# Patient Record
Sex: Male | Born: 1975 | Race: White | Hispanic: Yes | Marital: Married | Smoking: Light tobacco smoker
Health system: Southern US, Community
[De-identification: ages and names within clinical notes are randomized; demographics above are authoritative.]

---

## 2006-06-13 ENCOUNTER — Emergency Department (HOSPITAL_COMMUNITY): Admission: EM | Admit: 2006-06-13 | Discharge: 2006-06-13 | Payer: Self-pay | Admitting: Emergency Medicine

## 2007-08-25 ENCOUNTER — Emergency Department (HOSPITAL_COMMUNITY): Admission: EM | Admit: 2007-08-25 | Discharge: 2007-08-25 | Payer: Self-pay | Admitting: Emergency Medicine

## 2008-03-30 ENCOUNTER — Encounter: Admission: RE | Admit: 2008-03-30 | Discharge: 2008-03-30 | Payer: Self-pay | Admitting: Specialist

## 2013-03-23 ENCOUNTER — Ambulatory Visit (INDEPENDENT_AMBULATORY_CARE_PROVIDER_SITE_OTHER): Payer: Self-pay | Admitting: Emergency Medicine

## 2013-03-23 VITALS — BP 136/86 | HR 90 | Temp 97.8°F | Resp 16 | Ht 70.0 in | Wt 235.0 lb

## 2013-03-23 DIAGNOSIS — M25561 Pain in right knee: Secondary | ICD-10-CM

## 2013-03-23 DIAGNOSIS — M25569 Pain in unspecified knee: Secondary | ICD-10-CM

## 2013-03-23 LAB — POCT CBC
Granulocyte percent: 57.3 %G (ref 37–80)
HEMATOCRIT: 48 % (ref 43.5–53.7)
HEMOGLOBIN: 15.6 g/dL (ref 14.1–18.1)
LYMPH, POC: 3.1 (ref 0.6–3.4)
MCH, POC: 31.8 pg — AB (ref 27–31.2)
MCHC: 32.5 g/dL (ref 31.8–35.4)
MCV: 97.7 fL — AB (ref 80–97)
MID (cbc): 0.7 (ref 0–0.9)
MPV: 9.3 fL (ref 0–99.8)
POC GRANULOCYTE: 5.2 (ref 2–6.9)
POC LYMPH %: 34.7 % (ref 10–50)
POC MID %: 8 %M (ref 0–12)
Platelet Count, POC: 284 10*3/uL (ref 142–424)
RBC: 4.91 M/uL (ref 4.69–6.13)
RDW, POC: 13.7 %
WBC: 9 10*3/uL (ref 4.6–10.2)

## 2013-03-23 LAB — GLUCOSE, POCT (MANUAL RESULT ENTRY): POC GLUCOSE: 68 mg/dL — AB (ref 70–99)

## 2013-03-23 MED ORDER — TRAMADOL HCL 50 MG PO TABS: 50.0000 mg | ORAL_TABLET | Freq: Three times a day (TID) | ORAL | Status: DC | PRN

## 2013-03-23 MED ORDER — MELOXICAM 15 MG PO TABS: 15.0000 mg | ORAL_TABLET | Freq: Every day | ORAL | Status: DC

## 2013-03-23 NOTE — Patient Instructions (Addendum)
I am very concerned about the amount of alcohol you are drinking. I would like for you to try to decrease your alcohol consumption.   Dolor Patelofemoral (Patellofemoral Pain) Los exmenes muestran que el dolor que sufre en la rodilla se debe a un problema en la rtula. Este problema tambin se denomina dolor patelofemoral, rodilla de corredor o condromalacia. La mayor parte de las veces, el problema se debe al uso excesivo de la articulacin de la rodilla. Al doblar y Building control surveyor, puede irritar la zona interior de la rtula. Cuando esto ocurre, las [employer] correr, Advertising account planner, trepar, Chief Strategy Officer. Tambin puede sentir dolor despus de estar mucho tiempo sentado. Otros sntomas son: rigidez Nurse, learning disability, hinchazn y Neomia Dear sensacin de golpeteo o Merchant navy officer con Conservation officer, historic buildings. El xito del tratamiento se logra con reposo y rehabilitacin. En pocos casos es necesaria [place]. El tratamiento incluye la correccin de todos los factores mecnicos que puedan lesionar el normal funcionamiento de la rodilla. Esto puede debilitar los msculos del muslo o traer [employer] [facility]. Evite las actividades repetitivas de la rodilla hasta que el dolor y otros sntomas mejoren. El dolor y la inflamacin pueden reducirse aplicando bolsas de hielo en la lesin por 20 a 30 minutos cada 2 a 4 horas. Utilice los medicamentos de venta libre o de prescripcin para Chief Technology Officer, Environmental health practitioner o la [place], segn se lo indique el profesional que lo asiste. Los aparatos ortopdicos y las rodilleras de Education officer, environmental a Geographical information systems officer. Los ejercicios de rehabilitacin para fortalecer el cudriceps se indican cuando los sntomas mejoran. Llame al profesional que lo asiste para Education officer, environmental un control tal como le hayan recomendado. Document Released: 01/12/2005 Document Revised: 04/06/2011 [facility] Patient Information 2014 [place], [place]. Gota  (Gout)  La gota es  una artritis inflamatoria originada por la acumulacin de cristales de cido rico en las articulaciones. El cido rico es una sustancia qumica que normalmente se encuentra en la [place]. Cuando los niveles de cido rico en la sangre son muy elevados, pueden formarse cristales que se depositan en las articulaciones y los tejidos. Esto causa irritacin, dolor e hinchazn (inflamacin). La repeticin de los ataques es frecuente. Con el tiempo, los cristales de cido rico pueden formar [employer] (tofos) cerca de Risk analyst, destruyen el [place] y provocan una deformidad La gota es una enfermedad que puede tratarse y con frecuencia puede prevenirse. CAUSAS  La enfermedad comienza con niveles altos de cido rico en la sangre. El organismo produce cido rico cuando metaboliza una sustancia que se encuentra en [place] natural, denominada purina. Ciertos alimentos, como carnes y pescado, contienen grandes cantidades de purinas. Las causas de cido rico elevado son:   Ser transmitida de padres a hijos (hereditaria).  Enfermedades que causan un aumento de la produccin de cido rico (como la obesidad, la psoriasis y ciertos tipos de Database administrator).  Abuso en el consumo de bebidas alcohlicas.  La dieta, especialmente las [employer] se consume [place] carne y frutos de mar.  Ciertos medicamentos, como los que combaten el cncer (quimioterapia), los diurticos y la aspirina.  Enfermedades renales crnicas. Los riones no pueden eliminar bien el cido rico.  Problemas con el metabolismo. Las enfermedades fuertemente asociadas a la gota son:   Janene Harvey.  Hipertensin arterial.  Colesterol elevado.  Diabetes. No todas las personas con niveles elevados de cido rico sufren gota. No se comprende an por qu algunas personas padecen gota y otras no.  Las [place], lesiones en una articulacin y el consumo excesivo de ciertos alimentos son algunos de los factores que pueden provocar ataques de [place].   SNTOMAS   Un ataque de gota puede comenzar rpidamente. Causa un dolor intenso, con irritacin, hinchazn y calor en una articulacin.  Puede haber fiebre.  Generalmente slo una articulacin es [place]. Ciertas articulaciones se ven implicadas con ms frecuencia.  La base del dedo gordo del pie.  La rodilla.  El tobillo.  [place].  Un dedo. Sin tratamiento, un ataque por lo general desaparece en unos pocos das o semanas. Entre un ataque y otro, por lo general no hay sntomas, lo cual es diferente de [place] formas de artritis.  DIAGNSTICO  El profesional reunir la informacin basndose en los sntomas y el examen fsico. En algunos casos indicar estudios. Estos estudios pueden ser:   Anlisis de [place].  Anlisis de [place].  Radiografas.  Anlisis de los fluidos de Nurse, learning disability. En este estudio se necesita una aguja para extraer lquido de la articulacin (artrocentesis). Con el uso del microscopio, se confirma la gota cuando se observan los cristales de cido rico en el lquido de Nurse, learning disability. TRATAMIENTO  ONEOK fases en el tratamiento de la gota: el tratamiento del ataque de aparicin repentina (agudo) y la prevencin de los ataques (profilaxis).   Tratamiento de un ataque agudo.  Se utilizan medicamentos. Se indican antiinflamatorios o corticoides.  En algunos casos es necesario inyectar un corticoide en la articulacin afectada.  La articulacin dolorosa se pone en reposo. El movimiento puede empeorar la artritis.  Puede utilizar tanto tratamientos con calor o con fro para Scientific laboratory technician, segn lo que le haga mejor.  Tratamiento para prevenir ataques.  Si sufre de ataques de gota frecuentes, el mdico puede recomendarle medicamentos preventivos. Estos medicamentos se administran despus de que el ataque agudo [place]. Estos medicamentos ayudan a los riones a [place] cido rico del organismo o a Conservator, museum/gallery  produccin de cido rico. Es posible que deba Chemical engineer estos medicamentos durante un largo [place].  La primera fase del tratamiento preventiva puede asociarse con un aumento de los ataques agudos de [place]. Por esta razn, durante los primeros meses de [place], [place] mdico puede tambin aconsejarle que tome medicamentos que habitualmente se [place] para el tratamiento de la gota aguda. Asegrese de comprender todas las indicaciones del mdico. El mdico podr hacer varios ajustes a la dosis del medicamento antes de que comiencen a ser Geologist, engineering.  Comente el tratamiento diettico con su mdico o nutricionista. El alcohol y las bebidas que contienen gran cantidad de International aid/development worker y fructosa y los alimentos como la carne, el pollo y los frutos de mar pueden aumentar los niveles de cido rico. El mdico o el nutricionista podr [employer] las bebidas y los alimentos que debe limitar. INSTRUCCIONES PARA EL CUIDADO EN EL HOGAR   No tome aspirina para el dolor. Esto eleva los niveles de cido rico.  Slo tome medicamentos de venta libre o prescriptos para Primary school teacher, las molestias o Publishing copy la fiebre segn las indicaciones de su mdico.  Sports administrator reposo todo el tiempo que pueda. Cuando se encuentre en la cama, mantenga las sbanas y [address] alejadas de las articulaciones doloridas.  Mantenga la articulacin afectada levantada (elevada).  Aplique compresas fras o calientes sobre las articulaciones doloridas. el uso de compresas calientes o fras depende de lo que [employer] resulte a usted.  Utilice muletas si la articulacin que  le duele es de la pierna.  Debe ingerir gran cantidad de lquido para mantener la orina de tono claro o color amarillo plido. Esto ayudar a que el organismo elimine el cido rico. Limite el consumo de alcohol, bebidas azucaradas y que contengan fructosa.  Siga las indicaciones con respecto a la dieta. Preste especial atencin a la cantidad de protenas que consume. En la  dieta diaria debe enfatizar el consumo de frutas, vegetales, granos enteros y productos lcteos descremados. Comente con su mdico o nutricionista acerca del consumo de caf, vitamina C o cerezas. Estos pueden ayudar a Bear Stearnsdisminuir los niveles de cido rico.  Mantenga un peso corporal adecuado. SOLICITE ATENCIN MDICA SI:   Tiene diarrea, vmitos o algn efecto secundario provocado por los medicamentos.  No se siente mejor en 24 horas, o empeora. SOLICITE ATENCIN MDICA DE INMEDIATO SI:   Las articulaciones le duelen ms de manera repentina, tiene escalofros o fiebre. ASEGRESE DE QUE:   Comprende estas instrucciones.  Controlar su enfermedad.  Solicitar ayuda de inmediato si no mejora o si empeora. Document Released: 10/22/2004 Document Revised: 05/09/2012 [facility] Patient Information 2014 [place] EyeExitCare, [place].

## 2013-03-23 NOTE — Progress Notes (Addendum)
Subjective:    Patient ID: Alex GingerFernando Sally, male    DOB: 27-Oct-1975, 38 y.o.   MRN: [id number] This chart was scribed for Collene GobbleSteven A Bailey Kolbe, MD by Valera CastleSteven Perry, ED Scribe. This patient was seen in room 04 and the patient's care was started at 11:39 AM.  Chief Complaint  Patient presents with  . Knee Pain    Right kee    HPI Alex Gordon is a 38 y.o. male Pt reports right knee pain, onset 3 years ago, worsened last night. He reports that bending, extending his knee exacerbates his pain. He denies any trauma, injury. He reports abnormal gait, onset this morning due to his knee pain. He denies pain in his right ankle, toes. He denies h/o gout. He reports h/o EtOH use, intermittent days. He reports having xrays done on his knee in the past. 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 He denies any other symptoms.   PCP - No PCP Per Patient  There are no active problems to display for this patient.  History reviewed. No pertinent past medical history. History reviewed. No pertinent past surgical history. No Known Allergies Prior to Admission medications   Not on File   Review of Systems  Musculoskeletal: Positive for arthralgias (right knee) and gait problem. Negative for back pain.  Neurological: Negative for weakness and numbness.      Objective:   Physical Exam CONSTITUTIONAL: Well developed/well nourished HEAD: Normocephalic/atraumatic EYES: EOMI/PERRL ENMT: Mucous membranes moist NECK: supple no meningeal signs SPINE: entire spine nontender CV: S1/S2 noted, no murmurs/rubs/gallops noted LUNGS: Lungs are clear to auscultation bilaterally, no apparent distress NEURO: Pt is awake/alert. EXTREMITIES: pulses normal, Exquisite tenderness over superior portion of patellar and inferior portion on right. Unable to flex right knee at all.   SKIN: warm, color normal PSYCH: no abnormalities of mood noted  BP 136/86  Pulse 90  Temp(Src) 97.8 F (36.6 C) (Oral)  Resp 16  Ht 5\' 10"  (1.778  m)  Wt 235 lb (106.595 kg)  BMI 33.72 kg/m2  SpO2 95% Patient has had 2 sets of x-rays of his right knee one in 2011 and 2012  and written reports on those films are normal.    Results for orders placed in visit on 03/23/13  POCT CBC      Result Value Ref Range   WBC 9.0  4.6 - 10.2 K/uL   Lymph, poc 3.1  0.6 - 3.4   POC LYMPH PERCENT 34.7  10 - 50 %L   MID (cbc) 0.7  0 - 0.9   POC MID % 8.0  0 - 12 %M   POC Granulocyte 5.2  2 - 6.9   Granulocyte percent 57.3  37 - 80 %G   RBC 4.91  4.69 - 6.13 M/uL   Hemoglobin 15.6  14.1 - 18.1 g/dL   HCT, POC 78.248.0  95.643.5 - 53.7 %   MCV 97.7 (*) 80 - 97 fL   MCH, POC 31.8 (*) 27 - 31.2 pg   MCHC 32.5  31.8 - 35.4 g/dL   RDW, POC 21.313.7     Platelet Count, POC 284  142 - 424 K/uL   MPV 9.3  0 - 99.8 fL  GLUCOSE, POCT (MANUAL RESULT ENTRY)      Result Value Ref Range   POC Glucose 68 (*) 70 - 99 mg/dl    Assessment & Plan:   We'll treat with a  Ultram  for pain and Mobic 15 a day . I told  him I was concerned that his knee pain may be coming from gout and advised him to cut back on alcohol and gave him patient information regarding gout. I reviewed the previous 2 x-ray reports and they are read as normal.   **Disclaimer: This note was dictated with voice recognition software. Similar sounding words can inadvertently be transcribed and this note may contain transcription errors which may not have been corrected upon publication of note.**   I personally performed the services described in this documentation, which was scribed in my presence. The recorded information has been reviewed and is accurate.

## 2013-03-24 ENCOUNTER — Ambulatory Visit: Payer: Self-pay

## 2013-03-24 ENCOUNTER — Telehealth: Payer: Self-pay | Admitting: Family Medicine

## 2013-03-24 LAB — URIC ACID: URIC ACID, SERUM: 7.9 mg/dL — AB (ref 4.0–7.8)

## 2013-03-24 LAB — COMPLETE METABOLIC PANEL WITH GFR
ALBUMIN: 4.6 g/dL (ref 3.5–5.2)
ALT: 41 U/L (ref 0–53)
AST: 26 U/L (ref 0–37)
Alkaline Phosphatase: 88 U/L (ref 39–117)
BUN: 10 mg/dL (ref 6–23)
CALCIUM: 9.6 mg/dL (ref 8.4–10.5)
CHLORIDE: 102 meq/L (ref 96–112)
CO2: 27 meq/L (ref 19–32)
CREATININE: 0.77 mg/dL (ref 0.50–1.35)
GFR, Est Non African American: >89 mL/min
Glucose, Bld: 95 mg/dL (ref 70–99)
POTASSIUM: 4.2 meq/L (ref 3.5–5.3)
Sodium: 134 mEq/L — ABNORMAL LOW (ref 135–145)
Total Bilirubin: 0.5 mg/dL (ref 0.2–1.2)
Total Protein: 7.5 g/dL (ref 6.0–8.3)

## 2013-03-24 MED ORDER — OXAPROZIN 600 MG PO TABS: 1200.0000 mg | ORAL_TABLET | Freq: Every day | ORAL | Status: DC

## 2013-03-24 NOTE — Telephone Encounter (Signed)
Patient presenting to office requesting a change in medication; prescribed Meloxicam and Tramadol by Dr. Cleta Albertsaub for knee pain on 03/23/13; requesting switch to Oxaprozin 600mg  1 bid which as worked well for him in the past.  He is willing to be seen again if needed but is self pay.  Agreeable to send in rx for Oxaprozin; should stop Meloxicam.  Please advise patient to contact office if Oxaprozin is not effective.  [facility] for FYI.

## 2013-03-25 ENCOUNTER — Ambulatory Visit: Payer: Self-pay | Admitting: Family Medicine

## 2013-03-25 VITALS — BP 126/68 | HR 83 | Temp 99.4°F | Resp 16 | Ht 70.0 in | Wt 235.0 lb

## 2013-03-25 DIAGNOSIS — M25569 Pain in unspecified knee: Secondary | ICD-10-CM

## 2013-03-25 DIAGNOSIS — M109 Gout, unspecified: Secondary | ICD-10-CM

## 2013-03-25 DIAGNOSIS — M25561 Pain in right knee: Secondary | ICD-10-CM

## 2013-03-25 MED ORDER — METHYLPREDNISOLONE ACETATE 80 MG/ML IJ SUSP
80.0000 mg | Freq: Once | INTRAMUSCULAR | Status: AC
  Administered 2013-03-25: 80 mg via INTRA_ARTICULAR

## 2013-03-25 MED ORDER — METHYLPREDNISOLONE ACETATE 80 MG/ML IJ SUSP: 80.0000 mg | Freq: Once | INTRAMUSCULAR | Status: DC

## 2013-03-25 NOTE — Telephone Encounter (Signed)
Thanks for taking care of this. All the best. Brett CanalesSteve

## 2013-03-25 NOTE — Progress Notes (Signed)
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 Some external tenderness on lateral upper pole of knee cap.  Has had the same problem 2 and 1/2 years ago which resolved with shot.  Objective:  NAD Swollen right knee.  Cannot bend without pain.  Tender right lateral patella  Patient right knee injected with marcaine and depomedrol with relief of acute pain  Assessment: gout  Gout attack - Plan: methylPREDNISolone acetate (DEPO-MEDROL) injection 80 mg  Knee pain, right - Plan: methylPREDNISolone acetate (DEPO-MEDROL) injection 80 mg  Signed, Elvina SidleKurt Bradshaw Minihan, MD

## 2013-03-25 NOTE — Patient Instructions (Signed)
Gota  °(Gout) ° La gota es una artritis inflamatoria originada por la acumulación de cristales de ácido úrico en las articulaciones. El ácido úrico es una sustancia química que normalmente se encuentra en la sangre. Cuando los niveles de ácido úrico en la sangre son muy elevados, pueden formarse cristales que se depositan en las articulaciones y los tejidos. Esto causa irritación, dolor e hinchazón (inflamación). La repetición de los ataques es frecuente. Con el tiempo, los cristales de ácido úrico pueden formar masas (tofos) cerca de una articulación, destruyen el hueso y provocan una deformidad La gota es una enfermedad que puede tratarse y con frecuencia puede prevenirse. °CAUSAS  °La enfermedad comienza con niveles altos de ácido úrico en la sangre. El organismo produce ácido úrico cuando metaboliza una sustancia que se encuentra en estado natural, denominada purina. Ciertos alimentos, como carnes y pescado, contienen grandes cantidades de purinas. Las causas de ácido úrico elevado son:  °· Ser transmitida de padres a hijos (hereditaria). °· Enfermedades que causan un aumento de la producción de ácido úrico (como la obesidad, la psoriasis y ciertos tipos de cáncer). °· Abuso en el consumo de bebidas alcohólicas. °· La dieta, especialmente las dietas en las que se consume mucha carne y frutos de mar. °· Ciertos medicamentos, como los que combaten el cáncer (quimioterapia), los diuréticos y la aspirina. °· Enfermedades renales crónicas. Los riñones no pueden eliminar bien el ácido úrico. °· Problemas con el metabolismo. °Las enfermedades fuertemente asociadas a la gota son:  °· Obesidad. °· Hipertensión arterial. °· Colesterol elevado. °· Diabetes. °No todas las personas con niveles elevados de ácido úrico sufren gota. No se comprende aún por qué algunas personas padecen gota y otras no. Las cirugías, lesiones en una articulación y el consumo excesivo de ciertos alimentos son algunos de los factores que pueden  provocar ataques de gota.  °SÍNTOMAS  °· Un ataque de gota puede comenzar rápidamente. Causa un dolor intenso, con irritación, hinchazón y calor en una articulación. °· Puede haber fiebre. °· Generalmente sólo una articulación es afectada. Ciertas articulaciones se ven implicadas con más frecuencia. °· La base del dedo gordo del pie. °· La rodilla. °· El tobillo. °· La muñeca. °· Un dedo. °Sin tratamiento, un ataque por lo general desaparece en unos pocos días o semanas. Entre un ataque y otro, por lo general no hay síntomas, lo cual es diferente de muchas otras formas de artritis.  °DIAGNÓSTICO  °El profesional reunirá la información basándose en los síntomas y el examen físico. En algunos casos indicará estudios. Estos estudios pueden ser:  °· Análisis de sangre. °· Análisis de orina. °· Radiografías. °· Análisis de los fluidos de la articulación. En este estudio se necesita una aguja para extraer líquido de la articulación (artrocentesis). Con el uso del microscopio, se confirma la gota cuando se observan los cristales de ácido úrico en el líquido de la articulación. °TRATAMIENTO  °Hay dos fases en el tratamiento de la gota: el tratamiento del ataque de aparición repentina (agudo) y la prevención de los ataques (profilaxis).  °· Tratamiento de un ataque agudo. °· Se utilizan medicamentos. Se indican antiinflamatorios o corticoides. °· En algunos casos es necesario inyectar un corticoide en la articulación afectada. °· La articulación dolorosa se pone en reposo. El movimiento puede empeorar la artritis. °· Puede utilizar tanto tratamientos con calor o con frío para aliviar el dolor en las articulaciones, según lo que le haga mejor. °· Tratamiento para prevenir ataques. °· Si sufre de ataques de gota frecuentes,   el médico puede recomendarle medicamentos preventivos. Estos medicamentos se administran después de que el ataque agudo mejora. Estos medicamentos ayudan a los riñones a eliminar el ácido úrico del  organismo o a disminuir la producción de ácido úrico. Es posible que deba utilizar estos medicamentos durante un largo tiempo. °· La primera fase del tratamiento preventiva puede asociarse con un aumento de los ataques agudos de gota. Por esta razón, durante los primeros meses de tratamiento, su médico puede también aconsejarle que tome medicamentos que habitualmente se utilizan para el tratamiento de la gota aguda. Asegúrese de comprender todas las indicaciones del médico. El médico podrá hacer varios ajustes a la dosis del medicamento antes de que comiencen a ser efectivos. °· Comente el tratamiento dietético con su médico o nutricionista. El alcohol y las bebidas que contienen gran cantidad de azúcar y fructosa y los alimentos como la carne, el pollo y los frutos de mar pueden aumentar los niveles de ácido úrico. El médico o el nutricionista podrá aconsejarlo sobre las bebidas y los alimentos que debe limitar. °INSTRUCCIONES PARA EL CUIDADO EN EL HOGAR  °· No tome aspirina para el dolor. Esto eleva los niveles de ácido úrico. °· Sólo tome medicamentos de venta libre o prescriptos para calmar el dolor, las molestias o bajar la fiebre según las indicaciones de su médico. °· Haga reposo todo el tiempo que pueda. Cuando se encuentre en la cama, mantenga las sábanas y mantas alejadas de las articulaciones doloridas. °· Mantenga la articulación afectada levantada (elevada). °· Aplique compresas frías o calientes sobre las articulaciones doloridas. el uso de compresas calientes o frías depende de lo que mejor le resulte a usted. °· Utilice muletas si la articulación que le duele es de la pierna. °· Debe ingerir gran cantidad de líquido para mantener la orina de tono claro o color amarillo pálido. Esto ayudará a que el organismo elimine el ácido úrico. Limite el consumo de alcohol, bebidas azucaradas y que contengan fructosa. °· Siga las indicaciones con respecto a la dieta. Preste especial atención a la cantidad de  proteínas que consume. En la dieta diaria debe enfatizar el consumo de frutas, vegetales, granos enteros y productos lácteos descremados. Comente con su médico o nutricionista acerca del consumo de café, vitamina C o cerezas. Estos pueden ayudar a disminuir los niveles de ácido úrico. °· Mantenga un peso corporal adecuado. °SOLICITE ATENCIÓN MÉDICA SI:  °· Tiene diarrea, vómitos o algún efecto secundario provocado por los medicamentos. °· No se siente mejor en 24 horas, o empeora. °SOLICITE ATENCIÓN MÉDICA DE INMEDIATO SI:  °· Las articulaciones le duelen más de manera repentina, tiene escalofríos o fiebre. °ASEGÚRESE DE QUE:  °· Comprende estas instrucciones. °· Controlará su enfermedad. °· Solicitará ayuda de inmediato si no mejora o si empeora. °Document Released: 10/22/2004 Document Revised: 05/09/2012 °ExitCare® Patient Information ©2014 ExitCare, LLC. ° °

## 2013-03-27 ENCOUNTER — Telehealth: Payer: Self-pay

## 2013-03-27 DIAGNOSIS — M25569 Pain in unspecified knee: Secondary | ICD-10-CM

## 2013-03-27 NOTE — Telephone Encounter (Signed)
Please schedule ortho referral

## 2013-03-27 NOTE — Telephone Encounter (Signed)
lmom to cb  We are scheduling him with ortho

## 2013-03-27 NOTE — Telephone Encounter (Signed)
PT RETURNED CALL NOT SURE WHO CONTACTED HIM.he HAD A MISSED CALL. PLEASE ADVICE [phone]

## 2013-03-27 NOTE — Addendum Note (Signed)
Addended by: Elvina SidleLAUENSTEIN, Aliscia Clayton on: 03/27/2013 12:01 PM   Modules accepted: Orders

## 2013-03-27 NOTE — Telephone Encounter (Signed)
PT WAS TOLD TO CALL BACK IF HIS KNEE WAS STILL SWOLLEN AND IT IS. PLEASE CALL [phone]

## 2013-03-27 NOTE — Telephone Encounter (Signed)
PT RETURNED CALL NOT SURE WHO CONTACTED HIM.he HAD A MISSED CALL. PLEASE ADVICE [phone]  

## 2013-03-27 NOTE — Telephone Encounter (Signed)
What should we tell pt to do next.

## 2013-03-28 NOTE — Telephone Encounter (Signed)
Left detailed message explaining to pt that we have referred him to Ortho for the knee  swelling.

## 2014-01-30 ENCOUNTER — Ambulatory Visit (INDEPENDENT_AMBULATORY_CARE_PROVIDER_SITE_OTHER): Payer: Self-pay

## 2014-01-30 ENCOUNTER — Ambulatory Visit (INDEPENDENT_AMBULATORY_CARE_PROVIDER_SITE_OTHER): Payer: Self-pay | Admitting: Family Medicine

## 2014-01-30 VITALS — BP 118/72 | HR 73 | Temp 98.0°F | Resp 18 | Ht 70.0 in | Wt 235.0 lb

## 2014-01-30 DIAGNOSIS — M25511 Pain in right shoulder: Secondary | ICD-10-CM

## 2014-01-30 DIAGNOSIS — H11001 Unspecified pterygium of right eye: Secondary | ICD-10-CM

## 2014-01-30 MED ORDER — MELOXICAM 7.5 MG PO TABS: 7.5000 mg | ORAL_TABLET | Freq: Every day | ORAL | Status: DC

## 2014-01-30 NOTE — Patient Instructions (Addendum)
We will refer you to eye doctor for the growth on your right eye. They will call you.  meloxicam 1 to 2 each day as needed for shoulder, then as pain is improving - can start exercises below.   Shoulder Range of Motion Exercises The shoulder is the most flexible joint in the human body. Because of this it is also the most unstable joint in the body. All ages can develop shoulder problems. Early treatment of problems is necessary for a good outcome. People react to shoulder pain by decreasing the movement of the joint. After a brief period of time, the shoulder can become "frozen". This is an almost complete loss of the ability to move the damaged shoulder. Following injuries your caregivers can give you instructions on exercises to keep your range of motion (ability to move your shoulder freely), or regain it if it has been lost.  EXERCISES EXERCISES TO MAINTAIN THE MOBILITY OF YOUR SHOULDER: Codman's Exercise or Pendulum Exercise  This exercise may be performed in a prone (face-down) lying position or standing while leaning on a chair with the opposite arm. Its purpose is to relax the muscles in your shoulder and slowly but surely increase the range of motion and to relieve pain.  Lie on your stomach close to the side edge of the bed. Let your weak arm hang over the edge of the bed. Relax your shoulder, arm and hand. Let your shoulder blade relax and drop down.  Slowly and gently swing your arm forward and back. Do not use your neck muscles; relax them. It might be easier to have someone else gently start swinging your arm.  As pain decreases, increase your swing. To start, arm swing should begin at 15 degree angles. In time and as pain lessens, move to 30-45 degree angles. Start with swinging for about 15 seconds, and work towards swinging for 3 to 5 minutes.  This exercise may also be performed in a standing/bent over position.  Stand and hold onto a sturdy chair with your good arm. Bend  forward at the waist and bend your knees slightly to help protect your back. Relax your weak arm, let it hang limp. Relax your shoulder blade and let it drop.  Keep your shoulder relaxed and use body motion to swing your arm in small circles.  Stand up tall and relax.  Repeat motion and change direction of circles.  Start with swinging for about 30 seconds, and work towards swinging for 3 to 5 minutes. STRETCHING EXERCISES:  Lift your arm out in front of you with the elbow bent at 90 degrees. Using your other arm gently pull the elbow forward and across your body.  Bend one arm behind you with the palm facing outward. Using the other arm, hold a towel or rope and reach this arm up above your head, then bend it at the elbow to move your wrist to behind your neck. Grab the free end of the towel with the hand behind your back. Gently pull the towel up with the hand behind your neck, gradually increasing the pull on the hand behind the small of your back. Then, gradually pull down with the hand behind the small of your back. This will pull the hand and arm behind your neck further. Both shoulders will have an increased range of motion with repetition of this exercise. STRENGTHENING EXERCISES:  Standing with your arm at your side and straight out from your shoulder with the elbow bent at 90  degrees, hold onto a small weight and slowly raise your hand so it points straight up in the air. Repeat this five times to begin with, and gradually increase to ten times. Do this four times per day. As you grow stronger you can gradually increase the weight.  Repeat the above exercise, only this time using an elastic band. Start with your hand up in the air and pull down until your hand is by your side. As you grow stronger, gradually increase the amount you pull by increasing the number or size of the elastic bands. Use the same amount of repetitions.  Standing with your hand at your side and holding onto a  weight, gradually lift the hand in front of you until it is over your head. Do the same also with the hand remaining at your side and lift the hand away from your body until it is again over your head. Repeat this five times to begin with, and gradually increase to ten times. Do this four times per day. As you grow stronger you can gradually increase the weight. Document Released: 10/11/2002 Document Revised: 01/17/2013 Document Reviewed: 01/12/2005 [facility] Patient Information 2015 [place], [place]. This information is not intended to replace advice given to you by your health care provider. Make sure you discuss any questions you have with your health care provider.

## 2014-01-30 NOTE — Progress Notes (Signed)
Subjective:    Patient ID: Alex Gordon, male    DOB: 02-22-1975, 39 y.o.   MRN: [id number] This chart was scribed for Alex Staggers, MD by Jolene Provost, Medical Scribe. This patient was seen in Room 11 and the patient's care was started a 11:24 AM.  Chief Complaint  Patient presents with  . Shoulder Pain    x1 mth both  . Shoulder Injury    rt shoulder worse after fall yesterday   . Advice Only    ref for eye appt     HPI HPI Comments: [name] is a 39 y.o. male who presents to [facility] complaining of right shoulder pain that started one month ago.   Pt has a hx of gout, last treated at [facility] in Feb 2015 with gout pain in right knee. Pt was treated treated with an injection of DepoMedrol at that time. Pt was seen two days later for the same condition and was treated with mobic and ultram.  Pt also endorses mild pain in his left shoulder. Pt states he lifted heavy boards onto his right shoulder one month ago and since then has had mild pain, and then yesterday fell off a 18 inch tall bucket onto his right shoulder and his pain has been increased since then. Pt was able to move the shoulder and arm after the fall with some pain. Pt denies weakness or numbness in arm, or limitations in ROM. PT has not taken any medications PTA. Pt is right hand dominant. Pt works in [employer].   Pt also endorses a growth on his right eye which he believes started after he had an eye infection several years ago. Pt denies visual disturbance or eye pain. Pt endorses occasional eye watering.   There are no active problems to display for this patient.  History reviewed. No pertinent past medical history. History reviewed. No pertinent past surgical history. No Known Allergies Prior to Admission medications   Medication Sig Start Date End Date Taking? Authorizing Provider  meloxicam (MOBIC) 15 MG tablet Take 1 tablet (15 mg total) by mouth daily. 03/23/13  Yes Collene Gobble, MD  oxaprozin (DAYPRO)  600 MG tablet Take 2 tablets (1,200 mg total) by mouth daily. Patient not taking: Reported on 01/30/2014 03/24/13   Ethelda Chick, MD  traMADol (ULTRAM) 50 MG tablet Take 1 tablet (50 mg total) by mouth every 8 (eight) hours as needed. Patient not taking: Reported on 01/30/2014 03/23/13   Collene Gobble, MD   History   Social History  . Marital Status: Married    Spouse Name: N/A    Number of Children: N/A  . Years of Education: N/A   Occupational History  . Not on file.   Social History Main Topics  . Smoking status: Former Games developer  . Smokeless tobacco: Not on file  . Alcohol Use: 0.0 oz/week    0 Not specified per week  . Drug Use: No  . Sexual Activity: Not on file   Other Topics Concern  . Not on file   Social History Narrative    Review of Systems  Eyes: Negative for pain and visual disturbance.       Growth on the right eye.  Musculoskeletal: Positive for arthralgias.  Neurological: Negative for weakness and numbness.       Objective:   Physical Exam  Constitutional: He is oriented to person, place, and time. He appears well-developed and well-nourished.  HENT:  Head: Normocephalic and atraumatic.  On right eye, small pterygium.  Eyes: Pupils are equal, round, and reactive to light.  Neck: Neck supple.  Full ROM and unable to reproduce pain with neck testing.  Cardiovascular: Normal rate and regular rhythm.   Pulmonary/Chest: Effort normal and breath sounds normal. No respiratory distress.  Musculoskeletal:  On right shoulder skin intact. No [place] or clavicle tenderness. TTP on top of acromion on right and left. No apparent tenderness over AC joint. Full ROM in arms and shoulders, bilaterally. On right and left shoulder, full rotator cuff strength, negative NEER and [place], minimal pain with [place].  Neurological: He is alert and oriented to person, place, and time.  Skin: Skin is warm and dry.  Psychiatric: He has a normal mood and affect. His behavior is normal.    Nursing note and vitals reviewed.   Filed Vitals:   01/30/14 1114  BP: 118/72  Pulse: 73  Temp: 98 F (36.7 C)  TempSrc: Oral  Resp: 18  Height:  (1.778 m)  Weight: 235 lb (106.595 kg)  SpO2: 99%     [facility] reading (PRIMARY) by  Dr. Neva Seat. Right shoulder with questionable irregularity at the distal acromion, without discrete fracture.      Assessment & Plan:   Tal Kempker is a 39 y.o. male Right shoulder pain - Plan: DG Shoulder Right, meloxicam (MOBIC) 7.5 MG tablet  -contusion vs component of AC strain. Has from and full rtc strength. Trial of mobic 1-2 qd, rom then HEP as tolerated in AVS.  RTC if not improving in next few weeks. sooner if worse.   Pterygium eye, right - Plan: Ambulatory referral to Ophthalmology.  Advised to wear sunglasses/eye protection with sunlight.    Meds ordered this encounter  Medications  . meloxicam (MOBIC) 7.5 MG tablet    Sig: Take 1-2 tablets (7.5-15 mg total) by mouth daily.    Dispense:  30 tablet    Refill:  0   Patient Instructions  We will refer you to eye doctor for the growth on your right eye. They will call you.  meloxicam 1 to 2 each day as needed for shoulder, then as pain is improving - can start exercises below.   Shoulder Range of Motion Exercises The shoulder is the most flexible joint in the human body. Because of this it is also the most unstable joint in the body. All ages can develop shoulder problems. Early treatment of problems is necessary for a good outcome. People react to shoulder pain by decreasing the movement of the joint. After a brief period of time, the shoulder can become "frozen". This is an almost complete loss of the ability to move the damaged shoulder. Following injuries your caregivers can give you instructions on exercises to keep your range of motion (ability to move your shoulder freely), or regain it if it has been lost.  EXERCISES EXERCISES TO MAINTAIN THE MOBILITY OF YOUR  SHOULDER: Codman's Exercise or Pendulum Exercise  This exercise may be performed in a prone (face-down) lying position or standing while leaning on a chair with the opposite arm. Its purpose is to relax the muscles in your shoulder and slowly but surely increase the range of motion and to relieve pain.  Lie on your stomach close to the side edge of the bed. Let your weak arm hang over the edge of the bed. Relax your shoulder, arm and hand. Let your shoulder blade relax and drop down.  Slowly and gently swing your arm forward and  back. Do not use your neck muscles; relax them. It might be easier to have someone else gently start swinging your arm.  As pain decreases, increase your swing. To start, arm swing should begin at 15 degree angles. In time and as pain lessens, move to 30-45 degree angles. Start with swinging for about 15 seconds, and work towards swinging for 3 to 5 minutes.  This exercise may also be performed in a standing/bent over position.  Stand and hold onto a sturdy chair with your good arm. Bend forward at the waist and bend your knees slightly to help protect your back. Relax your weak arm, let it hang limp. Relax your shoulder blade and let it drop.  Keep your shoulder relaxed and use body motion to swing your arm in small circles.  Stand up tall and relax.  Repeat motion and change direction of circles.  Start with swinging for about 30 seconds, and work towards swinging for 3 to 5 minutes. STRETCHING EXERCISES:  Lift your arm out in front of you with the elbow bent at 90 degrees. Using your other arm gently pull the elbow forward and across your body.  Bend one arm behind you with the palm facing outward. Using the other arm, hold a towel or rope and reach this arm up above your head, then bend it at the elbow to move your wrist to behind your neck. Grab the free end of the towel with the hand behind your back. Gently pull the towel up with the hand behind your neck,  gradually increasing the pull on the hand behind the small of your back. Then, gradually pull down with the hand behind the small of your back. This will pull the hand and arm behind your neck further. Both shoulders will have an increased range of motion with repetition of this exercise. STRENGTHENING EXERCISES:  Standing with your arm at your side and straight out from your shoulder with the elbow bent at 90 degrees, hold onto a small weight and slowly raise your hand so it points straight up in the air. Repeat this five times to begin with, and gradually increase to ten times. Do this four times per day. As you grow stronger you can gradually increase the weight.  Repeat the above exercise, only this time using an elastic band. Start with your hand up in the air and pull down until your hand is by your side. As you grow stronger, gradually increase the amount you pull by increasing the number or size of the elastic bands. Use the same amount of repetitions.  Standing with your hand at your side and holding onto a weight, gradually lift the hand in front of you until it is over your head. Do the same also with the hand remaining at your side and lift the hand away from your body until it is again over your head. Repeat this five times to begin with, and gradually increase to ten times. Do this four times per day. As you grow stronger you can gradually increase the weight. Document Released: 10/11/2002 Document Revised: 01/17/2013 Document Reviewed: 01/12/2005 [facility] Patient Information 2015 [place], [place]. This information is not intended to replace advice given to you by your health care provider. Make sure you discuss any questions you have with your health care provider.       I personally performed the services described in this documentation, which was scribed in my presence. The recorded information has been reviewed and considered, and addended  by me as needed.

## 2014-04-16 ENCOUNTER — Ambulatory Visit (INDEPENDENT_AMBULATORY_CARE_PROVIDER_SITE_OTHER): Payer: Self-pay | Admitting: Family Medicine

## 2014-04-16 VITALS — BP 132/82 | HR 76 | Temp 97.9°F | Resp 16 | Ht 70.0 in | Wt 228.0 lb

## 2014-04-16 DIAGNOSIS — Z23 Encounter for immunization: Secondary | ICD-10-CM

## 2014-04-16 DIAGNOSIS — T148 Other injury of unspecified body region: Secondary | ICD-10-CM

## 2014-04-16 DIAGNOSIS — W540XXA Bitten by dog, initial encounter: Secondary | ICD-10-CM

## 2014-04-16 DIAGNOSIS — M25511 Pain in right shoulder: Secondary | ICD-10-CM

## 2014-04-16 MED ORDER — AMOXICILLIN-POT CLAVULANATE 875-125 MG PO TABS: 1.0000 | ORAL_TABLET | Freq: Two times a day (BID) | ORAL | Status: DC

## 2014-04-16 MED ORDER — MELOXICAM 7.5 MG PO TABS: 7.5000 mg | ORAL_TABLET | Freq: Every day | ORAL | Status: DC

## 2014-04-16 NOTE — Progress Notes (Signed)
[facility] [address], [place] [id number] [phone]  Date:  04/16/2014   Name:  Alex Gordon   DOB:  05-24-1975   MRN:  [id number]  PCP:  No PCP Per Patient    Chief Complaint: Animal Bite and Medication Refill   History of Present Illness:  Alex Gordon is a 39 y.o. very pleasant male patient who presents with the following:  He was bitten by his own dog 2 days ago.  He has a male and male (unsterilized) pair of husky's and the male bit him when he was trying to separate the dogs  He is unsure of the date of his last tetanus shot  There are no active problems to display for this patient.   History reviewed. No pertinent past medical history.  History reviewed. No pertinent past surgical history.  History  Substance Use Topics  . Smoking status: Former Games developermoker  . Smokeless tobacco: Not on file  . Alcohol Use: 0.0 oz/week    0 Standard drinks or equivalent per week    Family History  Problem Relation Age of Onset  . Diabetes Mother     No Known Allergies  Medication list has been reviewed and updated.  Current Outpatient Prescriptions on File Prior to Visit  Medication Sig Dispense Refill  . meloxicam (MOBIC) 7.5 MG tablet Take 1-2 tablets (7.5-15 mg total) by mouth daily. (Patient not taking: Reported on 04/16/2014) 30 tablet 0   No current facility-administered medications on file prior to visit.    Review of Systems:  As per HPI- otherwise negative.   Physical Examination: Filed Vitals:   04/16/14 1316  BP: 132/82  Pulse: 76  Temp: 97.9 F (36.6 C)  Resp: 16   Filed Vitals:   04/16/14 1316  Height: 5\' 10"  (1.778 m)  Weight: 228 lb (103.42 kg)   Body mass index is 32.71 kg/(m^2). Ideal Body Weight: Weight in (lb) to have BMI = 25: 173.9  GEN: WDWN, NAD, Non-toxic, A & O x 3, looks well HEENT: Atraumatic, Normocephalic. Neck supple. No masses, No LAD. Ears and Nose: No external deformity. CV: RRR, No M/G/R. No  JVD. No thrill. No extra heart sounds. PULM: CTA B, no wheezes, crackles, rhonchi. No retractions. No resp. distress. No accessory muscle use. EXTR: No c/c/e NEURO Normal gait.  PSYCH: Normally interactive. Conversant. Not depressed or anxious appearing.  Calm demeanor.  Left hand:  He has bite marks about the thenar eminence both dorsal and palmar sides.  All bite marks appear to be punctures, none are gaping or need to be closed.  There is mild redness and tenderness.  He has treated the area with OTC "purple lotion" that he got from a "[place]."    He declines the x-ray that I recommended to check for any fracture  Assessment and Plan: Dog bite - Plan: Tdap vaccine greater than or equal to 7yo IM, amoxicillin-clavulanate (AUGMENTIN) 875-125 MG per tablet  Right shoulder pain - Plan: meloxicam (MOBIC) 7.5 MG tablet  Updated tdap, start augmentin for mildly infected dog bite; asked him to please come back if not getting better. He filled out dog bite report- we faxed this for him, and I reminded him to let me know if he does not hear from animal control in the next few days  Refilled the mobic that he uses on occasion for joint pain  Signed Abbe AmsterdamJessica Copland, MD

## 2014-04-16 NOTE — Patient Instructions (Signed)
Use the augmentin antibiotic twice a day for 10 days.  If your hand is not continuing to improve please come back in! You should receive a phone call from animal control in the next few days- please let me know if you do not hear from them

## 2014-10-20 ENCOUNTER — Ambulatory Visit (INDEPENDENT_AMBULATORY_CARE_PROVIDER_SITE_OTHER): Payer: Self-pay

## 2014-10-20 ENCOUNTER — Ambulatory Visit (INDEPENDENT_AMBULATORY_CARE_PROVIDER_SITE_OTHER): Payer: Self-pay | Admitting: Family Medicine

## 2014-10-20 VITALS — BP 122/80 | HR 79 | Temp 98.2°F | Resp 16 | Ht 70.0 in | Wt 229.0 lb

## 2014-10-20 DIAGNOSIS — M791 Myalgia, unspecified site: Secondary | ICD-10-CM

## 2014-10-20 DIAGNOSIS — T148XXA Other injury of unspecified body region, initial encounter: Secondary | ICD-10-CM

## 2014-10-20 DIAGNOSIS — M25551 Pain in right hip: Secondary | ICD-10-CM

## 2014-10-20 DIAGNOSIS — Z23 Encounter for immunization: Secondary | ICD-10-CM

## 2014-10-20 MED ORDER — CYCLOBENZAPRINE HCL 10 MG PO TABS: 10.0000 mg | ORAL_TABLET | Freq: Three times a day (TID) | ORAL | Status: AC | PRN

## 2014-10-20 MED ORDER — MELOXICAM 7.5 MG PO TABS: 7.5000 mg | ORAL_TABLET | Freq: Every day | ORAL | Status: AC

## 2014-10-20 NOTE — Progress Notes (Signed)
[facility] [address] [place] [address] [phone]  Date:  10/20/2014   Name:  Alex Gordon   DOB:  March 21, 1975   MRN:  [id number]  PCP:  No PCP Per Patient    History of Present Illness:  Alex Gordon is a 39 y.o. male patient who presents to [facility] for chief complaint of hip pain that has occurred twice in 1 month.  Started when he was lifting a heavy object yesterday when he had a sharp pain at his right side.  No numbness or tingling.  It does not radiate.  He has tried some advil without much relief.  The first time it occurred was 1 month ago when the night before, he went dancing and woke up with this right hip pain that was less severe than now.  Never resurfaced until present state.  No dysuria, frequency or hematuria.  No fever.  No incontinence.        There are no active problems to display for this patient.   History reviewed. No pertinent past medical history.  History reviewed. No pertinent past surgical history.  Social History  Substance Use Topics  . Smoking status: Light Tobacco Smoker  . Smokeless tobacco: Never Used  . Alcohol Use: 0.0 oz/week    0 Standard drinks or equivalent per week    Family History  Problem Relation Age of Onset  . Diabetes Mother     No Known Allergies  Medication list has been reviewed and updated.  Current Outpatient Prescriptions on File Prior to Visit  Medication Sig Dispense Refill  . amoxicillin-clavulanate (AUGMENTIN) 875-125 MG per tablet Take 1 tablet by mouth 2 (two) times daily. (Patient not taking: Reported on 10/20/2014) 20 tablet 0   No current facility-administered medications on file prior to visit.    ROS ROS otherwise unremarkable unless listed above.   Physical Examination: BP 122/80 mmHg  Pulse 79  Temp(Src) 98.2 F (36.8 C) (Oral)  Resp 16  Ht  (1.778 m)  Wt 229 lb (103.874 kg)  BMI 32.86 kg/m2  SpO2 98% Ideal Body Weight: Weight in (lb) to have BMI = 25:  173.9  Physical Exam  Constitutional: He is oriented to person, place, and time. He appears well-developed and well-nourished. No distress.  HENT:  Head: Normocephalic and atraumatic.  Eyes: Conjunctivae and EOM are normal. Pupils are equal, round, and reactive to light.  Cardiovascular: Normal rate.   Pulmonary/Chest: Effort normal. No respiratory distress.  Abdominal: Soft. Normal appearance and bowel sounds are normal. There is no hepatomegaly. There is no tenderness.  Musculoskeletal: Normal range of motion.  No spinous tenderness.  No swelling, warmth, or erythema.  Tenderness along musculature just superior to pelvic bone.  Normal rom throughout lower extremity.  No decreased strength throughout lower extremity.  Pain incited only with lateral deviation at right side.  No pain with hip rotation.  Negative Faber's.     Neurological: He is alert and oriented to person, place, and time.  Skin: Skin is warm and dry. He is not diaphoretic.  Psychiatric: He has a normal mood and affect. His behavior is normal.    [facility] reading (PRIMARY) by  Dr. Neva Seat: Negative  Assessment and Plan: 39 year old male is here today for chief complaint of right hip pain.   No acute findings of hip.  This appears to be musculature.   Ice, NSAIDs, muscle relaxant and stretches given verbally and in handout.Marland Kitchen  rtc sxs worsen or fail to improve.     1. Right hip pain - DG HIP UNILAT W OR W/O PELVIS 2-3 VIEWS RIGHT  2. Right shoulder pain - meloxicam (MOBIC) 7.5 MG tablet; Take 1-2 tablets (7.5-15 mg total) by mouth daily.  Dispense: 30 tablet; Refill: 0  3. Muscular pain - cyclobenzaprine (FLEXERIL) 10 MG tablet; Take 1 tablet (10 mg total) by mouth 3 (three) times daily as needed for muscle spasms.  Dispense: 30 tablet; Refill: 0  4. Need for prophylactic vaccination and inoculation against influenza - Flu Vaccine QUAD 36+ mos IM   Trena Platt, PA-C [facility] 10/20/2014 11:40 AM

## 2014-10-20 NOTE — Patient Instructions (Addendum)
Low Back Sprain with Rehab  A sprain is an injury in which a ligament is torn. The ligaments of the lower back are vulnerable to sprains. However, they are strong and require great force to be injured. These ligaments are important for stabilizing the spinal column. Sprains are classified into three categories. Grade 1 sprains cause pain, but the tendon is not lengthened. Grade 2 sprains include a lengthened ligament, due to the ligament being stretched or partially ruptured. With grade 2 sprains there is still function, although the function may be decreased. Grade 3 sprains involve a complete tear of the tendon or muscle, and function is usually impaired. SYMPTOMS   Severe pain in the lower back.  Sometimes, a feeling of a "pop," "snap," or tear, at the time of injury.  Tenderness and sometimes swelling at the injury site.  Uncommonly, bruising (contusion) within 48 hours of injury.  Muscle spasms in the back. CAUSES  Low back sprains occur when a force is placed on the ligaments that is greater than they can handle. Common causes of injury include:  Performing a stressful act while off-balance.  Repetitive stressful activities that involve movement of the lower back.  Direct hit (trauma) to the lower back. RISK INCREASES WITH:  Contact sports (football, wrestling).  Collisions (major skiing accidents).  Sports that require throwing or lifting (baseball, weightlifting).  Sports involving twisting of the spine (gymnastics, diving, tennis, golf).  Poor strength and flexibility.  Inadequate protection.  Previous back injury or surgery (especially fusion). PREVENTION  Wear properly fitted and padded protective equipment.  Warm up and stretch properly before activity.  Allow for adequate recovery between workouts.  Maintain physical fitness:  Strength, flexibility, and endurance.  Cardiovascular fitness.  Maintain a healthy body weight. PROGNOSIS  If treated  properly, low back sprains usually heal with non-surgical treatment. The length of time for healing depends on the severity of the injury.  RELATED COMPLICATIONS   Recurring symptoms, resulting in a chronic problem.  Chronic inflammation and pain in the low back.  Delayed healing or resolution of symptoms, especially if activity is resumed too soon.  Prolonged impairment.  Unstable or arthritic joints of the low back. TREATMENT  Treatment first involves the use of ice and medicine, to reduce pain and inflammation. The use of strengthening and stretching exercises may help reduce pain with activity. These exercises may be performed at home or with a therapist. Severe injuries may require referral to a therapist for further evaluation and treatment, such as ultrasound. Your caregiver may advise that you wear a back brace or corset, to help reduce pain and discomfort. Often, prolonged bed rest results in greater harm then benefit. Corticosteroid injections may be recommended. However, these should be reserved for the most serious cases. It is important to avoid using your back when lifting objects. At night, sleep on your back on a firm mattress, with a pillow placed under your knees. If non-surgical treatment is unsuccessful, surgery may be needed.  MEDICATION   If pain medicine is needed, nonsteroidal anti-inflammatory medicines (aspirin and ibuprofen), or other minor pain relievers (acetaminophen), are often advised.  Do not take pain medicine for 7 days before surgery.  Prescription pain relievers may be given, if your caregiver thinks they are needed. Use only as directed and only as much as you need.  Ointments applied to the skin may be helpful.  Corticosteroid injections may be given by your caregiver. These injections should be reserved for the most serious cases,   because they may only be given a certain number of times. HEAT AND COLD  Cold treatment (icing) should be applied for 10  to 15 minutes every 2 to 3 hours for inflammation and pain, and immediately after activity that aggravates your symptoms. Use ice packs or an ice massage.  Heat treatment may be used before performing stretching and strengthening activities prescribed by your caregiver, physical therapist, or athletic trainer. Use a heat pack or a warm water soak. SEEK MEDICAL CARE IF:   Symptoms get worse or do not improve in 2 to 4 weeks, despite treatment.  You develop numbness or weakness in either leg.  You lose bowel or bladder function.  Any of the following occur after surgery: fever, increased pain, swelling, redness, drainage of fluids, or bleeding in the affected area.  New, unexplained symptoms develop. (Drugs used in treatment may produce side effects.) EXERCISES  RANGE OF MOTION (ROM) AND STRETCHING EXERCISES - Low Back Sprain Most people with lower back pain will find that their symptoms get worse with excessive bending forward (flexion) or arching at the lower back (extension). The exercises that will help resolve your symptoms will focus on the opposite motion.  Your physician, physical therapist or athletic trainer will help you determine which exercises will be most helpful to resolve your lower back pain. Do not complete any exercises without first consulting with your caregiver. Discontinue any exercises which make your symptoms worse, until you speak to your caregiver. If you have pain, numbness or tingling which travels down into your buttocks, leg or foot, the goal of the therapy is for these symptoms to move closer to your back and eventually resolve. Sometimes, these leg symptoms will get better, but your lower back pain may worsen. This is often an indication of progress in your rehabilitation. Be very alert to any changes in your symptoms and the activities in which you participated in the 24 hours prior to the change. Sharing this information with your caregiver will allow him or her to  most efficiently treat your condition. These exercises may help you when beginning to rehabilitate your injury. Your symptoms may resolve with or without further involvement from your physician, physical therapist or athletic trainer. While completing these exercises, remember:   Restoring tissue flexibility helps normal motion to return to the joints. This allows healthier, less painful movement and activity.  An effective stretch should be held for at least 30 seconds.  A stretch should never be painful. You should only feel a gentle lengthening or release in the stretched tissue. FLEXION RANGE OF MOTION AND STRETCHING EXERCISES: STRETCH - Flexion, Single Knee to Chest   Lie on a firm bed or floor with both legs extended in front of you.  Keeping one leg in contact with the floor, bring your opposite knee to your chest. Hold your leg in place by either grabbing behind your thigh or at your knee.  Pull until you feel a gentle stretch in your low back. Hold __________ seconds.  Slowly release your grasp and repeat the exercise with the opposite side. Repeat __________ times. Complete this exercise __________ times per day.  STRETCH - Flexion, Double Knee to Chest  Lie on a firm bed or floor with both legs extended in front of you.  Keeping one leg in contact with the floor, bring your opposite knee to your chest.  Tense your stomach muscles to support your back and then lift your other knee to your chest. Hold your legs   in place by either grabbing behind your thighs or at your knees.  Pull both knees toward your chest until you feel a gentle stretch in your low back. Hold __________ seconds.  Tense your stomach muscles and slowly return one leg at a time to the floor. Repeat __________ times. Complete this exercise __________ times per day.  STRETCH - Low Trunk Rotation  Lie on a firm bed or floor. Keeping your legs in front of you, bend your knees so they are both pointed toward the  ceiling and your feet are flat on the floor.  Extend your arms out to the side. This will stabilize your upper body by keeping your shoulders in contact with the floor.  Gently and slowly drop both knees together to one side until you feel a gentle stretch in your low back. Hold for __________ seconds.  Tense your stomach muscles to support your lower back as you bring your knees back to the starting position. Repeat the exercise to the other side. Repeat __________ times. Complete this exercise __________ times per day  EXTENSION RANGE OF MOTION AND FLEXIBILITY EXERCISES: STRETCH - Extension, Prone on Elbows   Lie on your stomach on the floor, a bed will be too soft. Place your palms about shoulder width apart and at the height of your head.  Place your elbows under your shoulders. If this is too painful, stack pillows under your chest.  Allow your body to relax so that your hips drop lower and make contact more completely with the floor.  Hold this position for __________ seconds.  Slowly return to lying flat on the floor. Repeat __________ times. Complete this exercise __________ times per day.  RANGE OF MOTION - Extension, Prone Press Ups  Lie on your stomach on the floor, a bed will be too soft. Place your palms about shoulder width apart and at the height of your head.  Keeping your back as relaxed as possible, slowly straighten your elbows while keeping your hips on the floor. You may adjust the placement of your hands to maximize your comfort. As you gain motion, your hands will come more underneath your shoulders.  Hold this position __________ seconds.  Slowly return to lying flat on the floor. Repeat __________ times. Complete this exercise __________ times per day.  RANGE OF MOTION- Quadruped, Neutral Spine   Assume a hands and knees position on a firm surface. Keep your hands under your shoulders and your knees under your hips. You may place padding under your knees for  comfort.  Drop your head and point your tailbone toward the ground below you. This will round out your lower back like an angry cat. Hold this position for __________ seconds.  Slowly lift your head and release your tail bone so that your back sags into a large arch, like an old horse.  Hold this position for __________ seconds.  Repeat this until you feel limber in your low back.  Now, find your "sweet spot." This will be the most comfortable position somewhere between the two previous positions. This is your neutral spine. Once you have found this position, tense your stomach muscles to support your low back.  Hold this position for __________ seconds. Repeat __________ times. Complete this exercise __________ times per day.  STRENGTHENING EXERCISES - Low Back Sprain These exercises may help you when beginning to rehabilitate your injury. These exercises should be done near your "sweet spot." This is the neutral, low-back arch, somewhere between fully rounded   and fully arched, that is your least painful position. When performed in this safe range of motion, these exercises can be used for people who have either a flexion or extension based injury. These exercises may resolve your symptoms with or without further involvement from your physician, physical therapist or athletic trainer. While completing these exercises, remember:   Muscles can gain both the endurance and the strength needed for everyday activities through controlled exercises.  Complete these exercises as instructed by your physician, physical therapist or athletic trainer. Increase the resistance and repetitions only as guided.  You may experience muscle soreness or fatigue, but the pain or discomfort you are trying to eliminate should never worsen during these exercises. If this pain does worsen, stop and make certain you are following the directions exactly. If the pain is still present after adjustments, discontinue the  exercise until you can discuss the trouble with your caregiver. STRENGTHENING - Deep Abdominals, Pelvic Tilt   Lie on a firm bed or floor. Keeping your legs in front of you, bend your knees so they are both pointed toward the ceiling and your feet are flat on the floor.  Tense your lower abdominal muscles to press your low back into the floor. This motion will rotate your pelvis so that your tail bone is scooping upwards rather than pointing at your feet or into the floor. With a gentle tension and even breathing, hold this position for __________ seconds. Repeat __________ times. Complete this exercise __________ times per day.  STRENGTHENING - Abdominals, Crunches   Lie on a firm bed or floor. Keeping your legs in front of you, bend your knees so they are both pointed toward the ceiling and your feet are flat on the floor. Cross your arms over your chest.  Slightly tip your chin down without bending your neck.  Tense your abdominals and slowly lift your trunk high enough to just clear your shoulder blades. Lifting higher can put excessive stress on the lower back and does not further strengthen your abdominal muscles.  Control your return to the starting position. Repeat __________ times. Complete this exercise __________ times per day.  STRENGTHENING - Quadruped, Opposite UE/LE Lift   Assume a hands and knees position on a firm surface. Keep your hands under your shoulders and your knees under your hips. You may place padding under your knees for comfort.  Find your neutral spine and gently tense your abdominal muscles so that you can maintain this position. Your shoulders and hips should form a rectangle that is parallel with the floor and is not twisted.  Keeping your trunk steady, lift your right hand no higher than your shoulder and then your left leg no higher than your hip. Make sure you are not holding your breath. Hold this position for __________ seconds.  Continuing to keep  your abdominal muscles tense and your back steady, slowly return to your starting position. Repeat with the opposite arm and leg. Repeat __________ times. Complete this exercise __________ times per day.  STRENGTHENING - Abdominals and Quadriceps, Straight Leg Raise   Lie on a firm bed or floor with both legs extended in front of you.  Keeping one leg in contact with the floor, bend the other knee so that your foot can rest flat on the floor.  Find your neutral spine, and tense your abdominal muscles to maintain your spinal position throughout the exercise.  Slowly lift your straight leg off the floor about 6 inches for a count   of 15, making sure to not hold your breath.  Still keeping your neutral spine, slowly lower your leg all the way to the floor. Repeat this exercise with each leg __________ times. Complete this exercise __________ times per day. POSTURE AND BODY MECHANICS CONSIDERATIONS - Low Back Sprain Keeping correct posture when sitting, standing or completing your activities will reduce the stress put on different body tissues, allowing injured tissues a chance to heal and limiting painful experiences. The following are general guidelines for improved posture. Your physician or physical therapist will provide you with any instructions specific to your needs. While reading these guidelines, remember:  The exercises prescribed by your provider will help you have the flexibility and strength to maintain correct postures.  The correct posture provides the best environment for your joints to work. All of your joints have less wear and tear when properly supported by a spine with good posture. This means you will experience a healthier, less painful body.  Correct posture must be practiced with all of your activities, especially prolonged sitting and standing. Correct posture is as important when doing repetitive low-stress activities (typing) as it is when doing a single heavy-load  activity (lifting). RESTING POSITIONS Consider which positions are most painful for you when choosing a resting position. If you have pain with flexion-based activities (sitting, bending, stooping, squatting), choose a position that allows you to rest in a less flexed posture. You would want to avoid curling into a fetal position on your side. If your pain worsens with extension-based activities (prolonged standing, working overhead), avoid resting in an extended position such as sleeping on your stomach. Most people will find more comfort when they rest with their spine in a more neutral position, neither too rounded nor too arched. Lying on a non-sagging bed on your side with a pillow between your knees, or on your back with a pillow under your knees will often provide some relief. Keep in mind, being in any one position for a prolonged period of time, no matter how correct your posture, can still lead to stiffness. PROPER SITTING POSTURE In order to minimize stress and discomfort on your spine, you must sit with correct posture. Sitting with good posture should be effortless for a healthy body. Returning to good posture is a gradual process. Many people can work toward this most comfortably by using various supports until they have the flexibility and strength to maintain this posture on their own. When sitting with proper posture, your ears will fall over your shoulders and your shoulders will fall over your hips. You should use the back of the chair to support your upper back. Your lower back will be in a neutral position, just slightly arched. You may place a small pillow or folded towel at the base of your lower back for  support.  When working at a desk, create an environment that supports good, upright posture. Without extra support, muscles tire, which leads to excessive strain on joints and other tissues. Keep these recommendations in mind: CHAIR:  A chair should be able to slide under your desk  when your back makes contact with the back of the chair. This allows you to work closely.  The chair's height should allow your eyes to be level with the upper part of your monitor and your hands to be slightly lower than your elbows. BODY POSITION  Your feet should make contact with the floor. If this is not possible, use a foot rest.  Keep your   ears over your shoulders. This will reduce stress on your neck and low back. INCORRECT SITTING POSTURES  If you are feeling tired and unable to assume a healthy sitting posture, do not slouch or slump. This puts excessive strain on your back tissues, causing more damage and pain. Healthier options include:  Using more support, like a lumbar pillow.  Switching tasks to something that requires you to be upright or walking.  Talking a brief walk.  Lying down to rest in a neutral-spine position. PROLONGED STANDING WHILE SLIGHTLY LEANING FORWARD  When completing a task that requires you to lean forward while standing in one place for a long time, place either foot up on a stationary 2-4 inch high object to help maintain the best posture. When both feet are on the ground, the lower back tends to lose its slight inward curve. If this curve flattens (or becomes too large), then the back and your other joints will experience too much stress, tire more quickly, and can cause pain. CORRECT STANDING POSTURES Proper standing posture should be assumed with all daily activities, even if they only take a few moments, like when brushing your teeth. As in sitting, your ears should fall over your shoulders and your shoulders should fall over your hips. You should keep a slight tension in your abdominal muscles to brace your spine. Your tailbone should point down to the ground, not behind your body, resulting in an over-extended swayback posture.  INCORRECT STANDING POSTURES  Common incorrect standing postures include a forward head, locked knees and/or an excessive  swayback. WALKING Walk with an upright posture. Your ears, shoulders and hips should all line-up. PROLONGED ACTIVITY IN A FLEXED POSITION When completing a task that requires you to bend forward at your waist or lean over a low surface, try to find a way to stabilize 3 out of 4 of your limbs. You can place a hand or elbow on your thigh or rest a knee on the surface you are reaching across. This will provide you more stability, so that your muscles do not tire as quickly. By keeping your knees relaxed, or slightly bent, you will also reduce stress across your lower back. CORRECT LIFTING TECHNIQUES DO :  Assume a wide stance. This will provide you more stability and the opportunity to get as close as possible to the object which you are lifting.  Tense your abdominals to brace your spine. Bend at the knees and hips. Keeping your back locked in a neutral-spine position, lift using your leg muscles. Lift with your legs, keeping your back straight.  Test the weight of unknown objects before attempting to lift them.  Try to keep your elbows locked down at your sides in order get the best strength from your shoulders when carrying an object.  Always ask for help when lifting heavy or awkward objects. INCORRECT LIFTING TECHNIQUES DO NOT:   Lock your knees when lifting, even if it is a small object.  Bend and twist. Pivot at your feet or move your feet when needing to change directions.  Assume that you can safely pick up even a paperclip without proper posture. Document Released: 01/12/2005 Document Revised: 04/06/2011 Document Reviewed: 04/26/2008 [facility] Patient Information 2015 [place], [place]. This information is not intended to replace advice given to you by your health care provider. Make sure you discuss any questions you have with your health care provider. Influenza Virus Vaccine injection What is this medicine? INFLUENZA VIRUS VACCINE (in floo EN zuh [facility]  ruhs vak SEEN) helps to  reduce the risk of getting influenza also known as the flu. The vaccine only helps protect you against some strains of the flu. This medicine may be used for other purposes; ask your health care provider or pharmacist if you have questions. COMMON BRAND NAME(S): Afluria, Agriflu, Fluarix, Fluarix Quadrivalent, FLUCELVAX, Flulaval, Fluvirin, Fluzone, Fluzone High-Dose, Fluzone Intradermal What should I tell my health care provider before I take this medicine? They need to know if you have any of these conditions: -bleeding disorder like hemophilia -fever or infection -Guillain-Barre syndrome or other neurological problems -immune system problems -infection with the human immunodeficiency virus (HIV) or AIDS -low blood platelet counts -multiple sclerosis -an unusual or allergic reaction to influenza virus vaccine, latex, other medicines, foods, dyes, or preservatives. Different brands of vaccines contain different allergens. Some may contain latex or eggs. Talk to your doctor about your allergies to make sure that you get the right vaccine. -pregnant or trying to get pregnant -breast-feeding How should I use this medicine? This vaccine is for injection into a muscle or under the skin. It is given by a health care professional. A copy of Vaccine Information Statements will be given before each vaccination. Read this sheet carefully each time. The sheet may change frequently. Talk to your healthcare provider to see which vaccines are right for you. Some vaccines should not be used in all age groups. Overdosage: If you think you have taken too much of this medicine contact a poison control center or emergency room at once. NOTE: This medicine is only for you. Do not share this medicine with others. What if I miss a dose? This does not apply. What may interact with this medicine? -chemotherapy or radiation therapy -medicines that lower your immune system like etanercept, anakinra, infliximab, and  adalimumab -medicines that treat or prevent blood clots like warfarin -phenytoin -steroid medicines like prednisone or cortisone -theophylline -vaccines This list may not describe all possible interactions. Give your health care provider a list of all the medicines, herbs, non-prescription drugs, or dietary supplements you use. Also tell them if you smoke, drink alcohol, or use illegal drugs. Some items may interact with your medicine. What should I watch for while using this medicine? Report any side effects that do not go away within 3 days to your doctor or health care professional. Call your health care provider if any unusual symptoms occur within 6 weeks of receiving this vaccine. You may still catch the flu, but the illness is not usually as bad. You cannot get the flu from the vaccine. The vaccine will not protect against colds or other illnesses that may cause fever. The vaccine is needed every year. What side effects may I notice from receiving this medicine? Side effects that you should report to your doctor or health care professional as soon as possible: -allergic reactions like skin rash, itching or hives, swelling of the face, lips, or tongue Side effects that usually do not require medical attention (report to your doctor or health care professional if they continue or are bothersome): -fever -headache -muscle aches and pains -pain, tenderness, redness, or swelling at the injection site -tiredness This list may not describe all possible side effects. Call your doctor for medical advice about side effects. You may report side effects to FDA at 1-800-FDA-1088. Where should I keep my medicine? The vaccine will be given by a health care professional in a clinic, pharmacy, doctor's office, or other health care setting. You will  not be given vaccine doses to store at home. NOTE: This sheet is a summary. It may not cover all possible information. If you have questions about this  medicine, talk to your doctor, pharmacist, or health care provider.  2015, Elsevier/Gold Standard. (2011-07-23 16:10:96)

## 2014-10-22 ENCOUNTER — Encounter: Payer: Self-pay | Admitting: Physician Assistant

## 2014-10-22 NOTE — Progress Notes (Signed)
Xray read and patient discussed with Alex Gordon. Agree with assessment and plan of care per her note.   

## 2016-03-20 ENCOUNTER — Ambulatory Visit (INDEPENDENT_AMBULATORY_CARE_PROVIDER_SITE_OTHER): Payer: Self-pay | Admitting: Physician Assistant

## 2016-03-20 VITALS — BP 140/82 | HR 79 | Temp 97.9°F | Ht 70.0 in | Wt 240.0 lb

## 2016-03-20 DIAGNOSIS — Z1389 Encounter for screening for other disorder: Secondary | ICD-10-CM

## 2016-03-20 DIAGNOSIS — Z0000 Encounter for general adult medical examination without abnormal findings: Secondary | ICD-10-CM

## 2016-03-20 DIAGNOSIS — Z130 Encounter for screening for diseases of the blood and blood-forming organs and certain disorders involving the immune mechanism: Secondary | ICD-10-CM

## 2016-03-20 DIAGNOSIS — Z13220 Encounter for screening for lipoid disorders: Secondary | ICD-10-CM

## 2016-03-20 DIAGNOSIS — G5622 Lesion of ulnar nerve, left upper limb: Secondary | ICD-10-CM

## 2016-03-20 DIAGNOSIS — Z13228 Encounter for screening for other metabolic disorders: Secondary | ICD-10-CM

## 2016-03-20 DIAGNOSIS — Z1329 Encounter for screening for other suspected endocrine disorder: Secondary | ICD-10-CM

## 2016-03-20 DIAGNOSIS — R0981 Nasal congestion: Secondary | ICD-10-CM

## 2016-03-20 LAB — POCT URINALYSIS DIP (MANUAL ENTRY)
BILIRUBIN UA: NEGATIVE
GLUCOSE UA: NEGATIVE
Ketones, POC UA: NEGATIVE
Leukocytes, UA: NEGATIVE
NITRITE UA: NEGATIVE
Protein Ur, POC: NEGATIVE
SPEC GRAV UA: 1.025
UROBILINOGEN UA: 0.2
pH, UA: 5.5

## 2016-03-20 MED ORDER — FLUTICASONE PROPIONATE 50 MCG/ACT NA SUSP: 2.0000 | Freq: Every day | NASAL | 6 refills | Status: DC

## 2016-03-20 NOTE — Patient Instructions (Addendum)
For arm numbness: Use a towel to wrap around your elbow to keep it straight at night when you are sleeping and see if this helps.   For blood pressure: Check it outside of the office over the next couple of weeks. If it is consistently >140/90, come back to the office as you may need to be on blood pressure medication.  For shoulder pain: Use tylenol as needed for pain. Also continue stretching and moving that arm daily to avoid stiffness.  For lab work: I will call you in the next few days and let you know if we need to add any medications.  Things for you to work on: 1) Decrease pork and beef consumption and increase vegetables as this will help lower cholesterol 2) Increase time spent in structured exercise! Even 10 minutes is better than none!  Thank you for letting me participate in your health and well being.  Cubital Tunnel Syndrome Introduction Cubital tunnel syndrome is a condition that causes pain and weakness of the forearm and hand. This condition happens when one of the nerves (ulnar nerve) that runs alongside the elbow joint becomes irritated. What are the causes? Causes of this condition include:  Increased pressure on the ulnar nerve at the elbow, arm, or forearm. This can be caused by:  Swollen tissues.  Ligaments.  Muscles.  Poorly healed elbow fractures.  Tumors in the elbow. These are usually noncancerous (benign).  Scar tissue that develops in the elbow after an injury.  Bony growths (spurs) near the ulnar nerve.  Stretching of the nerve due to loose elbow ligaments.  Trauma to the nerve at the elbow.  Repetitive elbow bending.  Certain medical conditions. What increases the risk? This condition is more likely to develop in:  People who do manual labor that requires frequently bending the elbow.  People who play sports that include repeated or strenuous throwing motions, such as baseball.  People who play contact sports, such as football or  lacrosse.  People who do not warm up properly before activities.  People who have diabetes.  People who have an underactive thyroid (hypothyroidism). What are the signs or symptoms? Symptoms of this condition include:  Clumsiness or weakness of the hand.  Tenderness of the inner elbow.  Aching or soreness of the inner elbow, forearm, or fingers, especially the little finger or the ring finger.  Increased pain with forced elbow bending.  Reduced control when throwing.  Tingling, numbness, or burning inside the forearm, or in part of the hand or fingers, especially the little finger or the ring finger.  Sharp pains that shoot from the elbow down to the wrist and hand.  The inability to grip or pinch hard. How is this diagnosed? This condition is diagnosed with a medical history and physical exam. Your health care provider will ask about your symptoms and ask for details about any injury. You may also have other tests, including:  Electromyogram (EMG). This test checks how well the nerve is working.  X-ray. How is this treated? Treatment starts by stopping the activities that are causing your symptoms to get worse. Treatment may include the use of icing and medicines to reduce pain and swelling. You may also be advised to wear a splint to prevent your elbow from bending or wear an elbow pad where the ulnar nerve is closest to the skin. In less severe cases, treatment may also include working with a physical therapist:  To help decrease your symptoms.  To improve  the strength and range of motion of your elbow, forearm, and hand. If the treatments described above do not help, surgery may be needed. Follow these instructions at home: If you have a splint:  Wear it as told by your health care provider. Remove it only as told by your health care provider.  Loosen the splint if your fingers become numb and tingle, or if they turn cold and blue.  Keep the splint clean and  dry. Managing pain, stiffness, and swelling  If directed, apply ice to the injured area:  Put ice in a plastic bag.  Place a towel between your skin and the bag.  Leave the ice on for 20 minutes, 2-3 times per day.  Move your fingers often to avoid stiffness and to lessen swelling.  Raise (elevate) the injured area above the level of your heart while you are sitting or lying down. General instructions  Take over-the-counter and prescription medicines only as told by your health care provider.  Keep all follow-up visits as told by your health care provider. This is important.  Do any exercise or physical therapy as told by your health care provider.  Do not drive or operate heavy machinery while taking prescription pain medicine.  If you were given an elbow pad, wear it as told by your health care provider. Contact a health care provider if:  Your symptoms get worse.  Your symptoms do not get better with treatment.  Your have new pain.  Your hand on the injured side feels numb or cold. This information is not intended to replace advice given to you by your health care provider. Make sure you discuss any questions you have with your health care provider. Document Released: 01/12/2005 Document Revised: 06/20/2015 Document Reviewed: 03/21/2014  2017 Elsevier   IF you received an x-ray today, you will receive an invoice from [facility]. Please contact [facility] at [phone] with questions or concerns regarding your invoice.   IF you received labwork today, you will receive an invoice from [facility]. Please contact [facility] at [phone] with questions or concerns regarding your invoice.   Our billing staff will not be able to assist you with questions regarding bills from these companies.  You will be contacted with the lab results as soon as they are available. The fastest way to get your results is to activate your My Chart account. Instructions are  located on the last page of this paperwork. If you have not heard from us regarding the results in 2 weeks, please contact this office.

## 2016-03-20 NOTE — Progress Notes (Signed)
Alex Gordon  MRN: [id number] DOB: 1975/09/25  Subjective:  Pt is a 41 y.o. male who presents for annual physical exam. Pt is fasting today.   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 He is sexually active with monogamous wife.   Diet: He likes beans, rice, beef, pork, and seafood. Will eat vegetables. He drinks mostly water. Will drink a little bit of soda. He drinks cranberry juice.   Exercise: Used to go to the gym but now he just walks a lot at work.   Bowel movements and urinary issues: Pt has 3 bowel movements a day which is normal for him. He reports no urinary issues, denies nocturia.   Concerns:  1) Stuffy nose and red eyes: Notes he is congested a lot. Typically will use afrin every few days for relief. Has no dx of allergic rhinitis.   2) Pt notes for the past few months he has been having numbness in his left forth and fifth digit and medial aspect of wrist when he wakes up at night. It is resloved when he moves his hand around. He does sleep with his left arm in bent positions.  Last annual physical: Never  Last dental exam: 12/2015 Last vision exam: 2015  Vaccinations      Tetanus: 04/16/2014      There are no active problems to display for this patient.   No current outpatient prescriptions on file prior to visit.   No current facility-administered medications on file prior to visit.     No Known Allergies  Social History   Social History  . Marital status: Married    Spouse name: N/A  . Number of children: N/A  . Years of education: N/A   Social History Main Topics  . Smoking status: Light Tobacco Smoker  . Smokeless tobacco: Never Used  . Alcohol use 7.2 oz/week    12 Cans of beer per week  . Drug use: No  . Sexual activity: Yes   Other Topics Concern  . None   Social History Narrative  . None    History reviewed. No pertinent surgical history.  Family History  Problem Relation Age of Onset  . Diabetes Mother      Review of Systems  Constitutional: Negative for activity change, appetite change, chills, diaphoresis, fatigue, fever and unexpected weight change.  HENT: Negative for congestion, dental problem, drooling, ear discharge, ear pain, facial swelling, hearing loss, mouth sores, nosebleeds, postnasal drip, rhinorrhea, sinus pain, sinus pressure, sneezing, sore throat, tinnitus, trouble swallowing and voice change.   Eyes: Negative for photophobia, pain, discharge, redness, itching and visual disturbance.  Respiratory: Negative for apnea, cough, choking, chest tightness, shortness of breath, wheezing and stridor.   Cardiovascular: Negative for chest pain, palpitations and leg swelling.  Gastrointestinal: Negative for abdominal distention, abdominal pain, anal bleeding, blood in stool, constipation, diarrhea, nausea, rectal pain and vomiting.  Endocrine: Negative for cold intolerance, heat intolerance, polydipsia, polyphagia and polyuria.  Genitourinary: Negative for decreased urine volume, difficulty urinating, discharge, dysuria, enuresis, flank pain, frequency, genital sores, hematuria, penile pain, penile swelling, scrotal swelling, testicular pain and urgency.  Musculoskeletal: Positive for arthralgias. Negative for back pain, gait problem, joint swelling, myalgias, neck pain and neck stiffness.  Skin: Negative for color change, pallor, rash and wound.  Allergic/Immunologic: Negative for environmental allergies, food allergies and immunocompromised state.  Neurological: Negative for dizziness, tremors, seizures, syncope, facial asymmetry, speech difficulty, weakness, light-headedness, numbness and headaches.  Hematological: Negative for  adenopathy. Does not bruise/bleed easily.  Psychiatric/Behavioral: Positive for sleep disturbance. Negative for agitation, behavioral problems, confusion, decreased concentration, dysphoric mood, hallucinations, self-injury and suicidal ideas. The patient is not  nervous/anxious and is not hyperactive.     Objective:  BP 140/82 (BP Location: Right Arm, Patient Position: Sitting, Cuff Size: Large)   Pulse 79   Temp 97.9 F (36.6 C) (Oral)   Ht 5' 10"  (1.778 m)   Wt 240 lb (108.9 kg)   SpO2 97%   BMI 34.44 kg/m   Physical Exam  Constitutional: He is oriented to person, place, and time and well-developed, well-nourished, and in no distress.  HENT:  Head: Normocephalic and atraumatic.  Right Ear: Hearing, tympanic membrane, external ear and ear canal normal.  Left Ear: Hearing, tympanic membrane, external ear and ear canal normal.  Nose: Mucosal edema (prominent bilaterally) present.  Mouth/Throat: Uvula is midline, oropharynx is clear and moist and mucous membranes are normal. No oropharyngeal exudate.  Eyes: EOM are normal. Pupils are equal, round, and reactive to light. Left conjunctiva is not injected. Left conjunctiva has no hemorrhage.  Pterygium noted on medial aspect of right eye  Neck: Trachea normal and normal range of motion.  Cardiovascular: Normal rate, regular rhythm, normal heart sounds and intact distal pulses.   Pulmonary/Chest: Effort normal and breath sounds normal.  Abdominal: Soft. Normal appearance and bowel sounds are normal.  Musculoskeletal: Normal range of motion.  Lymphadenopathy:       Head (right side): No submental, no submandibular, no tonsillar, no preauricular, no posterior auricular and no occipital adenopathy present.       Head (left side): No submental, no submandibular, no tonsillar, no preauricular, no posterior auricular and no occipital adenopathy present.    He has no cervical adenopathy.       Right: No supraclavicular adenopathy present.       Left: No supraclavicular adenopathy present.  Neurological: He is alert and oriented to person, place, and time. He has normal sensation, normal strength and normal reflexes. Gait normal.  Positive tinel's test at left elbow   Skin: Skin is warm and dry.    Psychiatric: Affect normal.  Vitals reviewed.   Visual Acuity Screening   Right eye Left eye Both eyes  Without correction: 20/15 20/25 20/15   With correction:      BP Readings from Last 3 Encounters:  03/20/16 140/82  10/20/14 122/80  04/16/14 132/82    Assessment and Plan :  Discussed healthy lifestyle, diet, exercise, preventative care, vaccinations, and addressed patient's concerns. Plan for follow up in one year. Otherwise, plan for specific conditions below.  1. Annual physical exam Await lab results   2. Screening for metabolic disorder - GYF74+BSWH  3. Screening, anemia, deficiency, iron - CBC with Differential/Platelet  4. Screening, lipid - Lipid panel  5. Screening for thyroid disorder - TSH  6. Screening for hematuria or proteinuria - POCT urinalysis dipstick  7. Nasal congestion -Instructed to discontinue afrin daily as this may be contributing to his issue.  - fluticasone (FLONASE) 50 MCG/ACT nasal spray; Place 2 sprays into both nostrils daily.  Dispense: 16 g; Refill: 6  8. Entrapment of left ulnar nerve -Given educational material on cubital tunnel syndrome. Instructed to wrap towel around elbow while sleeping at night to avoid bent position. If no improvement in 2 weeks return to clinic, consider imaging and orthopedic referral at this time.    Tenna Delaine PA-C  [facility] 03/20/2016 3:09 PM

## 2016-03-21 LAB — CMP14+EGFR
A/G RATIO: 1.5 (ref 1.2–2.2)
ALT: 30 IU/L (ref 0–44)
AST: 25 IU/L (ref 0–40)
Albumin: 4.6 g/dL (ref 3.5–5.5)
Alkaline Phosphatase: 90 IU/L (ref 39–117)
BUN/Creatinine Ratio: 13 (ref 9–20)
BUN: 10 mg/dL (ref 6–20)
Bilirubin Total: 0.6 mg/dL (ref 0.0–1.2)
CALCIUM: 9.5 mg/dL (ref 8.7–10.2)
CO2: 22 mmol/L (ref 18–29)
CREATININE: 0.75 mg/dL — AB (ref 0.76–1.27)
Chloride: 101 mmol/L (ref 96–106)
GFR, EST AFRICAN AMERICAN: 134 mL/min/{1.73_m2} (ref >59)
GFR, EST NON AFRICAN AMERICAN: 116 mL/min/{1.73_m2} (ref >59)
Globulin, Total: 3.1 g/dL (ref 1.5–4.5)
Glucose: 96 mg/dL (ref 65–99)
POTASSIUM: 4.2 mmol/L (ref 3.5–5.2)
Sodium: 140 mmol/L (ref 134–144)
TOTAL PROTEIN: 7.7 g/dL (ref 6.0–8.5)

## 2016-03-21 LAB — LIPID PANEL
Chol/HDL Ratio: 4.8 ratio units (ref 0.0–5.0)
Cholesterol, Total: 168 mg/dL (ref 100–199)
HDL: 35 mg/dL — AB (ref >39)
LDL CALC: 78 mg/dL (ref 0–99)
Triglycerides: 275 mg/dL — ABNORMAL HIGH (ref 0–149)
VLDL CHOLESTEROL CAL: 55 mg/dL — AB (ref 5–40)

## 2016-03-21 LAB — CBC WITH DIFFERENTIAL/PLATELET
BASOS: 1 %
Basophils Absolute: 0 10*3/uL (ref 0.0–0.2)
EOS (ABSOLUTE): 0.3 10*3/uL (ref 0.0–0.4)
EOS: 4 %
HEMATOCRIT: 42.9 % (ref 37.5–51.0)
HEMOGLOBIN: 14.8 g/dL (ref 13.0–17.7)
IMMATURE GRANS (ABS): 0 10*3/uL (ref 0.0–0.1)
IMMATURE GRANULOCYTES: 0 %
LYMPHS: 43 %
Lymphocytes Absolute: 2.7 10*3/uL (ref 0.7–3.1)
MCH: 31.7 pg (ref 26.6–33.0)
MCHC: 34.5 g/dL (ref 31.5–35.7)
MCV: 92 fL (ref 79–97)
MONOCYTES: 7 %
Monocytes Absolute: 0.5 10*3/uL (ref 0.1–0.9)
NEUTROS ABS: 2.9 10*3/uL (ref 1.4–7.0)
Neutrophils: 45 %
Platelets: 236 10*3/uL (ref 150–379)
RBC: 4.67 x10E6/uL (ref 4.14–5.80)
RDW: 14.6 % (ref 12.3–15.4)
WBC: 6.4 10*3/uL (ref 3.4–10.8)

## 2016-03-21 LAB — TSH: TSH: 2.33 u[IU]/mL (ref 0.450–4.500)

## 2016-03-26 ENCOUNTER — Encounter: Payer: Self-pay | Admitting: Radiology

## 2016-06-13 ENCOUNTER — Emergency Department (HOSPITAL_COMMUNITY)
Admission: EM | Admit: 2016-06-13 | Discharge: 2016-06-13 | Disposition: A | Discharge status: Home / Self Care | Payer: No Typology Code available for payment source | Attending: Emergency Medicine | Admitting: Emergency Medicine

## 2016-06-13 ENCOUNTER — Encounter (HOSPITAL_COMMUNITY): Payer: Self-pay

## 2016-06-13 ENCOUNTER — Emergency Department (HOSPITAL_COMMUNITY): Payer: No Typology Code available for payment source

## 2016-06-13 DIAGNOSIS — Y999 Unspecified external cause status: Secondary | ICD-10-CM | POA: Insufficient documentation

## 2016-06-13 DIAGNOSIS — Y939 Activity, unspecified: Secondary | ICD-10-CM | POA: Diagnosis not present

## 2016-06-13 DIAGNOSIS — R931 Abnormal findings on diagnostic imaging of heart and coronary circulation: Secondary | ICD-10-CM | POA: Diagnosis not present

## 2016-06-13 DIAGNOSIS — S3991XA Unspecified injury of abdomen, initial encounter: Secondary | ICD-10-CM | POA: Diagnosis present

## 2016-06-13 DIAGNOSIS — Y92410 Unspecified street and highway as the place of occurrence of the external cause: Secondary | ICD-10-CM | POA: Insufficient documentation

## 2016-06-13 DIAGNOSIS — R109 Unspecified abdominal pain: Secondary | ICD-10-CM

## 2016-06-13 DIAGNOSIS — F17200 Nicotine dependence, unspecified, uncomplicated: Secondary | ICD-10-CM | POA: Diagnosis not present

## 2016-06-13 LAB — CBC WITH DIFFERENTIAL/PLATELET
BASOS ABS: 0 10*3/uL (ref 0.0–0.1)
Basophils Relative: 1 %
EOS ABS: 0.2 10*3/uL (ref 0.0–0.7)
EOS PCT: 3 %
HCT: 40.6 % (ref 39.0–52.0)
Hemoglobin: 14.7 g/dL (ref 13.0–17.0)
LYMPHS PCT: 33 %
Lymphs Abs: 2.6 10*3/uL (ref 0.7–4.0)
MCH: 32.3 pg (ref 26.0–34.0)
MCHC: 36.2 g/dL — ABNORMAL HIGH (ref 30.0–36.0)
MCV: 89.2 fL (ref 78.0–100.0)
MONO ABS: 0.6 10*3/uL (ref 0.1–1.0)
Monocytes Relative: 8 %
Neutro Abs: 4.4 10*3/uL (ref 1.7–7.7)
Neutrophils Relative %: 55 %
PLATELETS: 213 10*3/uL (ref 150–400)
RBC: 4.55 MIL/uL (ref 4.22–5.81)
RDW: 13.1 % (ref 11.5–15.5)
WBC: 8 10*3/uL (ref 4.0–10.5)

## 2016-06-13 LAB — I-STAT CHEM 8, ED
BUN: 9 mg/dL (ref 6–20)
CALCIUM ION: 1.12 mmol/L — AB (ref 1.15–1.40)
CREATININE: 1 mg/dL (ref 0.61–1.24)
Chloride: 103 mmol/L (ref 101–111)
GLUCOSE: 105 mg/dL — AB (ref 65–99)
HCT: 41 % (ref 39.0–52.0)
HEMOGLOBIN: 13.9 g/dL (ref 13.0–17.0)
POTASSIUM: 3.5 mmol/L (ref 3.5–5.1)
Sodium: 140 mmol/L (ref 135–145)
TCO2: 26 mmol/L (ref 0–100)

## 2016-06-13 LAB — COMPREHENSIVE METABOLIC PANEL
ALT: 36 U/L (ref 17–63)
AST: 30 U/L (ref 15–41)
Albumin: 4 g/dL (ref 3.5–5.0)
Alkaline Phosphatase: 77 U/L (ref 38–126)
Anion gap: 8 (ref 5–15)
BUN: 9 mg/dL (ref 6–20)
CHLORIDE: 105 mmol/L (ref 101–111)
CO2: 23 mmol/L (ref 22–32)
Calcium: 8.9 mg/dL (ref 8.9–10.3)
Creatinine, Ser: 0.87 mg/dL (ref 0.61–1.24)
Glucose, Bld: 108 mg/dL — ABNORMAL HIGH (ref 65–99)
POTASSIUM: 3.4 mmol/L — AB (ref 3.5–5.1)
SODIUM: 136 mmol/L (ref 135–145)
Total Bilirubin: 0.7 mg/dL (ref 0.3–1.2)
Total Protein: 7.1 g/dL (ref 6.5–8.1)

## 2016-06-13 LAB — TYPE AND SCREEN
ABO/RH(D): O POS
ANTIBODY SCREEN: NEGATIVE

## 2016-06-13 LAB — ABO/RH: ABO/RH(D): O POS

## 2016-06-13 MED ORDER — SODIUM CHLORIDE 0.9 % IV BOLUS (SEPSIS)
1000.0000 mL | Freq: Once | INTRAVENOUS | Status: AC
  Administered 2016-06-13: 1000 mL via INTRAVENOUS

## 2016-06-13 MED ORDER — OXYCODONE-ACETAMINOPHEN 5-325 MG PO TABS: 1.0000 | ORAL_TABLET | Freq: Four times a day (QID) | ORAL | 0 refills | Status: DC | PRN

## 2016-06-13 MED ORDER — OXYCODONE-ACETAMINOPHEN 5-325 MG PO TABS
2.0000 | ORAL_TABLET | Freq: Once | ORAL | Status: AC
  Administered 2016-06-13: 2 via ORAL
  Filled 2016-06-13: qty 2

## 2016-06-13 MED ORDER — HYDROMORPHONE HCL 1 MG/ML IJ SOLN
1.0000 mg | Freq: Once | INTRAMUSCULAR | Status: AC
  Administered 2016-06-13: 1 mg via INTRAVENOUS
  Filled 2016-06-13: qty 1

## 2016-06-13 MED ORDER — IBUPROFEN 800 MG PO TABS: 800.0000 mg | ORAL_TABLET | Freq: Three times a day (TID) | ORAL | 0 refills | Status: DC

## 2016-06-13 MED ORDER — ONDANSETRON HCL 4 MG/2ML IJ SOLN
4.0000 mg | Freq: Once | INTRAMUSCULAR | Status: AC
  Administered 2016-06-13: 4 mg via INTRAVENOUS
  Filled 2016-06-13: qty 2

## 2016-06-13 MED ORDER — IOPAMIDOL (ISOVUE-300) INJECTION 61%
INTRAVENOUS | Status: AC
  Administered 2016-06-13: 100 mL
  Filled 2016-06-13: qty 100

## 2016-06-13 MED ORDER — FENTANYL CITRATE (PF) 100 MCG/2ML IJ SOLN
50.0000 ug | Freq: Once | INTRAMUSCULAR | Status: AC
  Administered 2016-06-13: 50 ug via INTRAVENOUS
  Filled 2016-06-13: qty 2

## 2016-06-13 NOTE — ED Notes (Signed)
Medication vial was discarded in med room sharps box prior to scanning. Accidentally discarded vial prior to scanning. Same was witnessed by Twanna HyGabe M, RN.
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
## 2016-06-17 ENCOUNTER — Other Ambulatory Visit (HOSPITAL_COMMUNITY): Payer: Self-pay | Admitting: Chiropractic Medicine

## 2016-06-17 ENCOUNTER — Ambulatory Visit (HOSPITAL_COMMUNITY)
Admission: RE | Admit: 2016-06-17 | Discharge: 2016-06-17 | Disposition: A | Discharge status: Home / Self Care | Payer: No Typology Code available for payment source | Source: Ambulatory Visit | Attending: Chiropractic Medicine | Admitting: Chiropractic Medicine

## 2016-06-17 DIAGNOSIS — M545 Low back pain, unspecified: Secondary | ICD-10-CM

## 2016-06-17 DIAGNOSIS — M47896 Other spondylosis, lumbar region: Secondary | ICD-10-CM | POA: Diagnosis not present

## 2016-07-06 ENCOUNTER — Encounter: Payer: Self-pay | Admitting: Family Medicine

## 2016-07-06 ENCOUNTER — Ambulatory Visit (INDEPENDENT_AMBULATORY_CARE_PROVIDER_SITE_OTHER): Payer: Self-pay

## 2016-07-06 ENCOUNTER — Ambulatory Visit (INDEPENDENT_AMBULATORY_CARE_PROVIDER_SITE_OTHER): Payer: Self-pay | Admitting: Family Medicine

## 2016-07-06 VITALS — BP 128/78 | HR 78 | Temp 98.1°F | Resp 18 | Ht 69.06 in | Wt 239.8 lb

## 2016-07-06 DIAGNOSIS — M7989 Other specified soft tissue disorders: Secondary | ICD-10-CM

## 2016-07-06 DIAGNOSIS — S90851A Superficial foreign body, right foot, initial encounter: Secondary | ICD-10-CM

## 2016-07-06 DIAGNOSIS — M109 Gout, unspecified: Secondary | ICD-10-CM

## 2016-07-06 DIAGNOSIS — M79671 Pain in right foot: Secondary | ICD-10-CM

## 2016-07-06 MED ORDER — COLCHICINE 0.6 MG PO TABS: ORAL_TABLET | ORAL | 0 refills | Status: DC

## 2016-07-06 NOTE — Patient Instructions (Addendum)
For gout - avoid foods that trigger gout like shrimp. If you have a gout flare - can take colchicine 2 pills once. 1 additional pill in 1 hour if not improved. Return to discuss gout further in the next 3-6 months.  You do have a metallic foreign body in the area of your discomfort on the right foot. I referred you to orthopedics to evaluate that area and decide if surgery is needed. Continue to wear hard soled shoes such as work. And keep laces over that area loose for comfort if needed.  Return to the clinic or go to the nearest emergency room if any of your symptoms worsen or new symptoms occur.   IF you received an x-ray today, you will receive an invoice from [facility]. Please contact [facility] at [phone] with questions or concerns regarding your invoice.   IF you received labwork today, you will receive an invoice from [facility]. Please contact [facility] at [phone] with questions or concerns regarding your invoice.   Our billing staff will not be able to assist you with questions regarding bills from these companies.  You will be contacted with the lab results as soon as they are available. The fastest way to get your results is to activate your My Chart account. Instructions are located on the last page of this paperwork. If you have not heard from us regarding the results in 2 weeks, please contact this office.
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
## 2016-07-17 ENCOUNTER — Ambulatory Visit (INDEPENDENT_AMBULATORY_CARE_PROVIDER_SITE_OTHER): Payer: Self-pay | Admitting: Orthopaedic Surgery

## 2016-10-19 ENCOUNTER — Encounter: Payer: Self-pay | Admitting: Family Medicine

## 2016-10-19 ENCOUNTER — Ambulatory Visit (INDEPENDENT_AMBULATORY_CARE_PROVIDER_SITE_OTHER): Payer: Self-pay | Admitting: Family Medicine

## 2016-10-19 VITALS — BP 132/80 | HR 84 | Temp 98.0°F | Resp 16 | Ht 70.87 in | Wt 239.0 lb

## 2016-10-19 DIAGNOSIS — L237 Allergic contact dermatitis due to plants, except food: Secondary | ICD-10-CM

## 2016-10-19 MED ORDER — TRIAMCINOLONE ACETONIDE 0.1 % EX CREA: 1.0000 "application " | TOPICAL_CREAM | Freq: Two times a day (BID) | CUTANEOUS | 0 refills | Status: DC

## 2016-10-19 MED ORDER — PREDNISONE 20 MG PO TABS: ORAL_TABLET | ORAL | 0 refills | Status: DC

## 2016-10-19 NOTE — Progress Notes (Signed)
Subjective:  By signing my name below, I, [name], attest that this documentation has been prepared under the direction and in the presence of [name], MD. Electronically Signed: [name], Scribe. 10/19/2016 , 4:18 PM .  Patient was seen in Room 11 .   Patient ID: Alex Gordon, male    DOB: 12/29/1975, 41 y.o.   MRN: [id number] Chief Complaint  Patient presents with  . Rash    pt states he belives he was working in [employer] since friday.    HPI Alex Gordon is a 41 y.o. male  Patient reports he was cutting some trees around his house 4 days ago. He didn't feel any rash until the day after (3 days ago), which were small patches of bumps. He noticed it started getting worse 2 days ago, and even worse yesterday with redness over the area. He's noticed rash appearing over his left neck, right neck and left abdomen; he reports face, mouth and genital are spared. He denies fever or trouble breathing. He did try some OTC poison ivy cream but it had some exfoliation beads so he stopped using it.   He's had poison ivy in the past, with relief receiving a shot. He denies trouble with sugar or diabetes in the past. He had glucose of 96 in February and 105 in May. He denies knowledge of taking prednisone in the past.   Lab Results  Component Value Date   GLUCOSE 105 (H) 06/13/2016    There are no active problems to display for this patient.  History reviewed. No pertinent past medical history. History reviewed. No pertinent surgical history. No Known Allergies Prior to Admission medications   Medication Sig Start Date End Date Taking? Authorizing Provider  colchicine 0.6 MG tablet 2 tabs once at onset of gout. 1 tab in 1 hour if needed. 07/06/16  Yes Shade Flood, MD   Social History   Social History  . Marital status: Married    Spouse name: N/A  . Number of children: N/A  . Years of education: N/A   Occupational History  . Not on file.   Social History  Main Topics  . Smoking status: Light Tobacco Smoker  . Smokeless tobacco: Never Used  . Alcohol use 7.2 oz/week    12 Cans of beer per week  . Drug use: No  . Sexual activity: Yes   Other Topics Concern  . Not on file   Social History Narrative  . No narrative on file   Review of Systems  Constitutional: Negative for fatigue, fever and unexpected weight change.  Eyes: Negative for visual disturbance.  Respiratory: Negative for cough, chest tightness and shortness of breath.   Cardiovascular: Negative for chest pain, palpitations and leg swelling.  Gastrointestinal: Negative for abdominal pain and blood in stool.  Skin: Positive for rash. Negative for wound.  Neurological: Negative for dizziness, weakness, light-headedness, numbness and headaches.       Objective:   Physical Exam  Constitutional: He is oriented to person, place, and time. He appears well-developed and well-nourished. No distress.  HENT:  Head: Normocephalic and atraumatic.  Eyes: Pupils are equal, round, and reactive to light. EOM are normal.  Neck: Neck supple.  Cardiovascular: Normal rate.   Pulmonary/Chest: Effort normal. No respiratory distress.  Musculoskeletal: Normal range of motion.  Neurological: He is alert and oriented to person, place, and time.  Skin: Skin is warm and dry.  Left arm: large patch of erythema with overlying  vesicles including areas of lines on his left arm that extends from mid forearm to mid upper arm There are some patches on his left neck, right neck and a small patch on his abdomen  Psychiatric: He has a normal mood and affect. His behavior is normal.  Nursing note and vitals reviewed.   Vitals:   10/19/16 1548  BP: 132/80  Pulse: 84  Resp: 16  Temp: 98 F (36.7 C)  TempSrc: Oral  SpO2: 98%  Weight: 239 lb (108.4 kg)  Height: 5' 10.87" (1.8 m)      Assessment & Plan:    Alex Gordon is a 41 y.o. male Contact dermatitis due to poison ivy - Plan: triamcinolone  cream (KENALOG) 0.1 %, predniSONE (DELTASONE) 20 MG tablet  - Appears typical of poison ivy with significant involvement on left arm. Does not appear to be infected at present, but precautions discussed. Does have a few patches on his left neck and abdomen. No face or genital involvement at present.  - Topical triamcinolone twice a day discussed to areas for now, along with over-the-counter Calamine or Caladryl lotion if needed.  - If continued spread of lesions, or affects face or genitals, start prednisone. Prescription printed for taper. Potential side effects and RTC precautions discussed. Spanish and [place] spoken with understanding expressed.  Meds ordered this encounter  Medications  . triamcinolone cream (KENALOG) 0.1 %    Sig: Apply 1 application topically 2 (two) times daily.    Dispense:  30 g    Refill:  0  . predniSONE (DELTASONE) 20 MG tablet    Sig: 3 by mouth for 3 days, then 2 by mouth for 2 days, then 1 by mouth for 2 days, then 1/2 by mouth for 2 days.    Dispense:  16 tablet    Refill:  0   Patient Instructions   crema 2 veces cada dia, y Calamine lotion (o Caladryl).  si tienes mas areas en la manana o affecta su cara, tomes prednisone.   regrese si se Personal assistant.   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 La reaccin de la piel a menudo incluye enrojecimiento, hinchazn, ampollas y Financial risk analyst. CAUSAS Esta afeccin se produce por una sustancia qumica especfica (urushiol) que se encuentra en la savia de la hiedra venenosa. La sustancia qumica es pegajosa y puede transmitirse fcilmente a personas, animales y [place]. Puede contraer dermatitis por hiedra venenosa en cualquiera de estos casos:  Tiene contacto directo con una planta de hiedra venenosa.  Toca animales, personas u objetos que han estado en contacto con la  hiedra venenosa y en los que ha quedado la sustancia qumica. FACTORES DE RIESGO Es ms probable que esta afeccin se manifieste en:  Personas que suelen estar al [employer].  Personas que estn al [employer] sin ropa de proteccin, como calzado cerrado, pantalones largos y camisa de Data processing manager. SNTOMAS Los sntomas de esta afeccin incluyen lo siguiente:  Enrojecimiento y picazn.  Una erupcin cutnea con bultos y ampollas. Generalmente, la erupcin cutnea aparece 48horas despus de la exposicin.  Hinchazn. Puede ocurrir si la reaccin es ms grave. Por lo general, los sntomas duran de 1 a 2semanas. Sin embargo, la primera vez que la afeccin aparece, los sntomas pueden durar entre 3 y [place]. DIAGNSTICO Esta afeccin se puede diagnosticar en funcin de  los sntomas y de un examen fsico. El mdico tambin puede hacerle preguntas sobre cualquier actividad reciente al [place]. TRATAMIENTO El tratamiento de esta afeccin variar en funcin de la gravedad. El tratamiento puede incluir lo siguiente:  Cremas con hidrocortisona o lociones con calamina para Associate Professor.  Baos de avena para calmar la piel.  Antihistamnicos en comprimidos de venta libre.  Corticoides por va oral para tratar las erupciones ms graves. INSTRUCCIONES PARA EL CUIDADO EN EL HOGAR  Tome o aplquese los medicamentos de venta libre y Building control surveyor como se lo haya indicado el mdico.  Lave la piel que haya estado expuesta con agua fra y jabn lo antes posible.  Utilice cremas con hidrocortisona o una locin con calamina para calmar la piel y Associate Professor, tanto y como sea necesario.  Tome baos de avena segn sea necesario. Use avena coloidal. Puede conseguirla en la tienda de comestibles o la farmacia local. Siga las instrucciones del envase.  No se rasque ni se refriegue la piel.  Mientras tenga erupcin cutnea, lave las prendas que use inmediatamente despus de  sacrselas. PREVENCIN  Aprenda a identificar la hiedra venenosa y evite el contacto con la planta. Esta planta puede reconocerse por la cantidad de hojas. En general, la hiedra venenosa tiene tres hojas, con ramas que florecen de un solo tallo. Las hojas suelen ser brillantes y tienen bordes irregulares que terminan en una punta en el frente.  Si ha estado expuesto a la hiedra venenosa, lvese muy bien con agua y [place] de inmediato. Tiene alrededor de para eliminar la resina de la planta antes de que le cause una erupcin cutnea. Asegrese de lavarse debajo de las uas de las [place], porque todo resto de resina seguir diseminando la erupcin cutnea.  Al hacer excursiones o ir de campamento, use ropa que lo ayude a evitar la exposicin de la piel. Esto incluye pantalones largos, camisa de [employer], medias altas y botas para caminar. Tambin puede aplicarse una locin preventiva en la piel, que lo ayudar a limitar la exposicin.  Si sospecha que su ropa o el equipo especfico para el aire libre entraron en contacto con una hiedra venenosa, enjuguelos con una manguera de jardn antes de llevarlos a su casa. SOLICITE ATENCIN MDICA SI:  Tiene lceras abiertas en la zona de la erupcin cutnea.  Tiene ms enrojecimiento, hinchazn o dolor en la zona afectada.  Tiene enrojecimiento que se extiende ms all de la zona de la erupcin cutnea.  Emana lquido, sangre o pus de la zona afectada.  Tiene fiebre.  Tiene una erupcin cutnea en una zona extensa del cuerpo.  Tiene una erupcin cutnea en los ojos, la boca o los genitales.  La erupcin cutnea no mejora despus de [employer]. SOLICITE ATENCIN MDICA DE INMEDIATO SI:  Se le hincha la cara o se le hinchan los prpados al punto de cerrarse.  Tiene dificultad para respirar.  Presenta dificultad para tragar. Esta informacin no tiene Theme park manager el consejo del mdico. Asegrese de hacerle al mdico cualquier  pregunta que tenga. Document Released: 10/22/2004 Document Revised: 05/06/2015 Document Reviewed: 06/20/2014 Elsevier Interactive Patient Education  2018 [employer].   IF you received an x-ray today, you will receive an invoice from [facility]. Please contact [facility] at [phone] with questions or concerns regarding your invoice.   IF you received labwork today, you will receive an invoice from [place]. Please contact [facility] at [phone] with questions or concerns regarding  your invoice.   Our billing staff will not be able to assist you with questions regarding bills from these companies.  You will be contacted with the lab results as soon as they are available. The fastest way to get your results is to activate your My Chart account. Instructions are located on the last page of this paperwork. If you have not heard from [place] regarding the results in 2 weeks, please contact this office.       I personally performed the services described in this documentation, which was scribed in my presence. The recorded information has been reviewed and considered for accuracy and completeness, addended by me as needed, and agree with information above.  Signed,   [name], MD Primary Care at [facility].  10/20/16 9:02 AM

## 2016-10-19 NOTE — Patient Instructions (Addendum)
crema 2 veces cada dia, y Calamine lotion (o Caladryl).  si tienes mas areas en la manana o affecta su cara, tomes prednisone.   regrese si se Personal assistant.   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 La reaccin de la piel a menudo incluye enrojecimiento, hinchazn, ampollas y Financial risk analyst. CAUSAS Esta afeccin se produce por una sustancia qumica especfica (urushiol) que se encuentra en la savia de la hiedra venenosa. La sustancia qumica es pegajosa y puede transmitirse fcilmente a personas, animales y [place]. Puede contraer dermatitis por hiedra venenosa en cualquiera de estos casos:  Tiene contacto directo con una planta de hiedra venenosa.  Toca animales, personas u objetos que han estado en contacto con la hiedra venenosa y en los que ha quedado la sustancia qumica. FACTORES DE RIESGO Es ms probable que esta afeccin se manifieste en:  Personas que suelen estar al [employer].  Personas que estn al [employer] sin ropa de proteccin, como calzado cerrado, pantalones largos y camisa de Data processing manager. SNTOMAS Los sntomas de esta afeccin incluyen lo siguiente:  Enrojecimiento y picazn.  Una erupcin cutnea con bultos y ampollas. Generalmente, la erupcin cutnea aparece 48horas despus de la exposicin.  Hinchazn. Puede ocurrir si la reaccin es ms grave. Por lo general, los sntomas duran de 1 a 2semanas. Sin embargo, la primera vez que la afeccin aparece, los sntomas pueden durar entre 3 y [place]. DIAGNSTICO Esta afeccin se puede diagnosticar en funcin de los sntomas y de un examen fsico. El mdico tambin puede hacerle preguntas sobre cualquier actividad reciente al [place]. TRATAMIENTO El tratamiento de esta afeccin variar en funcin de la gravedad. El tratamiento puede incluir lo siguiente:  Cremas con  hidrocortisona o lociones con calamina para Associate Professor.  Baos de avena para calmar la piel.  Antihistamnicos en comprimidos de venta libre.  Corticoides por va oral para tratar las erupciones ms graves. INSTRUCCIONES PARA EL CUIDADO EN EL HOGAR  Tome o aplquese los medicamentos de venta libre y Building control surveyor como se lo haya indicado el mdico.  Lave la piel que haya estado expuesta con agua fra y jabn lo antes posible.  Utilice cremas con hidrocortisona o una locin con calamina para calmar la piel y Associate Professor, tanto y como sea necesario.  Tome baos de avena segn sea necesario. Use avena coloidal. Puede conseguirla en la tienda de comestibles o la farmacia local. Siga las instrucciones del envase.  No se rasque ni se refriegue la piel.  Mientras tenga erupcin cutnea, lave las prendas que use inmediatamente despus de sacrselas. PREVENCIN  Aprenda a identificar la hiedra venenosa y evite el contacto con la planta. Esta planta puede reconocerse por la cantidad de hojas. En general, la hiedra venenosa tiene tres hojas, con ramas que florecen de un solo tallo. Las hojas suelen ser brillantes y tienen bordes irregulares que terminan en una punta en el frente.  Si ha estado expuesto a la hiedra venenosa, lvese muy bien con agua y [place] de inmediato. Tiene alrededor de para eliminar la resina de la planta antes de que le cause una erupcin cutnea. Asegrese de lavarse debajo de las uas de las [place], porque todo resto de resina seguir diseminando la erupcin cutnea.  Al hacer excursiones o ir de campamento, use ropa que lo ayude a Transport planner exposicin  de la piel. Esto incluye pantalones largos, camisa de [employer], medias altas y botas para caminar. Tambin puede aplicarse una locin preventiva en la piel, que lo ayudar a limitar la exposicin.  Si sospecha que su ropa o el equipo especfico para el aire libre entraron en contacto con una  hiedra venenosa, enjuguelos con una manguera de jardn antes de llevarlos a su casa. SOLICITE ATENCIN MDICA SI:  Tiene lceras abiertas en la zona de la erupcin cutnea.  Tiene ms enrojecimiento, hinchazn o dolor en la zona afectada.  Tiene enrojecimiento que se extiende ms all de la zona de la erupcin cutnea.  Emana lquido, sangre o pus de la zona afectada.  Tiene fiebre.  Tiene una erupcin cutnea en una zona extensa del cuerpo.  Tiene una erupcin cutnea en los ojos, la boca o los genitales.  La erupcin cutnea no mejora despus de [employer]. SOLICITE ATENCIN MDICA DE INMEDIATO SI:  Se le hincha la cara o se le hinchan los prpados al punto de cerrarse.  Tiene dificultad para respirar.  Presenta dificultad para tragar. Esta informacin no tiene Theme park manager el consejo del mdico. Asegrese de hacerle al mdico cualquier pregunta que tenga. Document Released: 10/22/2004 Document Revised: 05/06/2015 Document Reviewed: 06/20/2014 Elsevier Interactive Patient Education  2018 [employer].   IF you received an x-ray today, you will receive an invoice from [facility]. Please contact [facility] at [phone] with questions or concerns regarding your invoice.   IF you received labwork today, you will receive an invoice from [place] [facility]. Please contact [facility] at [phone] with questions or concerns regarding your invoice.   Our billing staff will not be able to assist you with questions regarding bills from these companies.  You will be contacted with the lab results as soon as they are available. The fastest way to get your results is to activate your My Chart account. Instructions are located on the last page of this paperwork. If you have not heard from [place] regarding the results in 2 weeks, please contact this office.

## 2016-11-05 ENCOUNTER — Ambulatory Visit (INDEPENDENT_AMBULATORY_CARE_PROVIDER_SITE_OTHER): Payer: Self-pay | Admitting: Family Medicine

## 2016-11-05 ENCOUNTER — Ambulatory Visit (INDEPENDENT_AMBULATORY_CARE_PROVIDER_SITE_OTHER): Payer: Self-pay

## 2016-11-05 ENCOUNTER — Encounter: Payer: Self-pay | Admitting: Family Medicine

## 2016-11-05 VITALS — BP 120/70 | HR 71 | Temp 98.3°F | Resp 18 | Ht 70.87 in | Wt 233.8 lb

## 2016-11-05 DIAGNOSIS — Z23 Encounter for immunization: Secondary | ICD-10-CM

## 2016-11-05 DIAGNOSIS — M25571 Pain in right ankle and joints of right foot: Secondary | ICD-10-CM

## 2016-11-05 DIAGNOSIS — S93401A Sprain of unspecified ligament of right ankle, initial encounter: Secondary | ICD-10-CM

## 2016-11-05 NOTE — Patient Instructions (Addendum)
Ankle pain appears to be due to a sprain. Swelling from yesterday was likely from walking too much. See information below on ankle sprain, over-the-counter Aleve or ibuprofen as needed, wear the ankle brace, and recheck in the next 1-2 weeks if not improving. Sooner if worse.  In the next 4-6 weeks, would recommend discussing gout further and can check a uric acid then if your ankle has improved at that time.   Ankle Sprain An ankle sprain is a stretch or tear in one of the tough, fiber-like tissues (ligaments) in the ankle. The ligaments in your ankle help to hold the bones of the ankle together. What are the causes? This condition is often caused by stepping on or falling on the outer edge of the foot. What increases the risk? This condition is more likely to develop in people who play sports. What are the signs or symptoms? Symptoms of this condition include:  Pain in your ankle.  Swelling.  Bruising. Bruising may develop right after you sprain your ankle or 1-2 days later.  Trouble standing or walking, especially when you turn or change directions.  How is this diagnosed? This condition is diagnosed with a physical exam. During the exam, your health care provider will press on certain parts of your foot and ankle and try to move them in certain ways. X-rays may be taken to see how severe the sprain is and to check for broken bones. How is this treated? This condition may be treated with:  A brace. This is used to keep the ankle from moving until it heals.  An elastic bandage. This is used to support the ankle.  Crutches.  Pain medicine.  Surgery. This may be needed if the sprain is severe.  Physical therapy. This may help to improve the range of motion in the ankle.  Follow these instructions at home:  Rest your ankle.  Take over-the-counter and prescription medicines only as told by your health care provider.  For 2-3 days, keep your ankle raised (elevated) above  the level of your heart as much as possible.  If directed, apply ice to the area: ? Put ice in a plastic bag. ? Place a towel between your skin and the bag. ? Leave the ice on for 20 minutes, 2-3 times a day.  If you were given a brace: ? Wear it as directed. ? Remove it to shower or bathe. ? Try not to move your ankle much, but wiggle your toes from time to time. This helps to prevent swelling.  If you were given an elastic bandage (dressing): ? Remove it to shower or bathe. ? Try not to move your ankle much, but wiggle your toes from time to time. This helps to prevent swelling. ? Adjust the dressing to make it more comfortable if it feels too tight. ? Loosen the dressing if you have numbness or tingling in your foot, or if your foot becomes cold and blue.  If you have crutches, use them as told by your health care provider. Continue to use them until you can walk without feeling pain in your ankle. Contact a health care provider if:  You have rapidly increasing bruising or swelling.  Your pain is not relieved with medicine. Get help right away if:  Your toes or foot becomes numb or blue.  You have severe pain that gets worse. This information is not intended to replace advice given to you by your health care provider. Make sure you discuss any  questions you have with your health care provider. Document Released: 01/12/2005 Document Revised: 05/22/2015 Document Reviewed: 08/14/2014 Elsevier Interactive Patient Education  2017 [employer].    IF you received an x-ray today, you will receive an invoice from [facility]. Please contact [facility] at [phone] with questions or concerns regarding your invoice.   IF you received labwork today, you will receive an invoice from [place]. Please contact [facility] at [phone] with questions or concerns regarding your invoice.   Our billing staff will not be able to assist you with questions regarding bills  from these companies.  You will be contacted with the lab results as soon as they are available. The fastest way to get your results is to activate your My Chart account. Instructions are located on the last page of this paperwork. If you have not heard from [place] regarding the results in 2 weeks, please contact this office.
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
## 2016-11-09 ENCOUNTER — Telehealth: Payer: Self-pay

## 2016-11-09 NOTE — Telephone Encounter (Signed)
We did discuss at his office visit that as long as the x-ray did not indicate any fractures, I was not planning on calling him. Based on his exam and history, I suspect he did have an ankle sprain, and would continue the plan as we discussed last visit. Let me know if there are further questions

## 2016-11-09 NOTE — Telephone Encounter (Signed)
Received call from pt asking about xray. Advised pt that xray did not show fractures at this time. Please advise with plan for pt.

## 2016-11-12 ENCOUNTER — Ambulatory Visit: Payer: Self-pay | Admitting: Family Medicine

## 2016-11-26 ENCOUNTER — Encounter (HOSPITAL_COMMUNITY): Payer: Self-pay | Admitting: Emergency Medicine

## 2016-11-26 ENCOUNTER — Emergency Department (HOSPITAL_COMMUNITY): Payer: Self-pay

## 2016-11-26 ENCOUNTER — Emergency Department (HOSPITAL_COMMUNITY)
Admission: EM | Admit: 2016-11-26 | Discharge: 2016-11-26 | Disposition: A | Discharge status: Home / Self Care | Payer: Self-pay | Attending: Emergency Medicine | Admitting: Emergency Medicine

## 2016-11-26 DIAGNOSIS — R52 Pain, unspecified: Secondary | ICD-10-CM

## 2016-11-26 DIAGNOSIS — F17210 Nicotine dependence, cigarettes, uncomplicated: Secondary | ICD-10-CM | POA: Insufficient documentation

## 2016-11-26 DIAGNOSIS — R609 Edema, unspecified: Secondary | ICD-10-CM

## 2016-11-26 DIAGNOSIS — M25571 Pain in right ankle and joints of right foot: Secondary | ICD-10-CM | POA: Insufficient documentation

## 2016-11-26 MED ORDER — IBUPROFEN 800 MG PO TABS: 800.0000 mg | ORAL_TABLET | Freq: Three times a day (TID) | ORAL | 0 refills | Status: DC

## 2016-11-26 NOTE — ED Triage Notes (Signed)
Pt reports L ankle sprain "over a month ago,"  C/o continued swelling and pain.

## 2016-11-26 NOTE — ED Provider Notes (Signed)
[facility] Provider Note   CSN: [id number] Arrival date & time: 11/26/16  [id number]     History   Chief Complaint Chief Complaint  Patient presents with  . Ankle Pain    HPI Alex Gordon is a 41 y.o. male.  The history is provided by the patient. No language interpreter was used.  Ankle Pain   Incident onset: 1 month. The incident occurred at home. The injury mechanism was torsion. The pain is present in the left ankle. The pain is moderate. The pain has been constant since onset. He reports no foreign bodies present. He has tried nothing for the symptoms. The treatment provided no relief.    History reviewed. No pertinent past medical history.  There are no active problems to display for this patient.   History reviewed. No pertinent surgical history.     Home Medications    Prior to Admission medications   Medication Sig Start Date End Date Taking? Authorizing Provider  ibuprofen (ADVIL,MOTRIN) 800 MG tablet Take 1 tablet (800 mg total) by mouth 3 (three) times daily. 11/26/16   Elson AreasSofia, Leslie K, PA-C    Family History Family History  Problem Relation Age of Onset  . Diabetes Mother     Social History Social History  Substance Use Topics  . Smoking status: Light Tobacco Smoker  . Smokeless tobacco: Never Used  . Alcohol use 7.2 oz/week    12 Cans of beer per week     Allergies   Patient has no known allergies.   Review of Systems Review of Systems  All other systems reviewed and are negative.    Physical Exam Updated Vital Signs BP 116/68 (BP Location: Right Arm)   Pulse 76   Temp 97.9 F (36.6 C) (Oral)   Resp 17   SpO2 97%   Physical Exam  Constitutional: He appears well-developed and well-nourished.  Musculoskeletal: He exhibits tenderness.  Swollen tender right ankle,  Pain with movement,  nv and ns intact  Neurological: He is alert.  Skin: Skin is warm.  Psychiatric: He has a normal mood and  affect.  Nursing note and vitals reviewed.    ED Treatments / Results  Labs (all labs ordered are listed, but only abnormal results are displayed) Labs Reviewed - No data to display  EKG  EKG Interpretation None       Radiology Dg Ankle Complete Right  Result Date: 11/26/2016 CLINICAL DATA:  Right ankle pain and swelling for 1 month. EXAM: RIGHT ANKLE - COMPLETE 3+ VIEW COMPARISON:  Radiographs of November 05, 2016. FINDINGS: There is no evidence of fracture, dislocation, or joint effusion. There is no evidence of arthropathy or other focal bone abnormality. Soft tissues are unremarkable. IMPRESSION: No significant abnormality seen in the right ankle. Electronically Signed   By: Lupita RaiderJames  Green Jr, M.D.   On: 11/26/2016 10:21    Procedures Procedures (including critical care time)  Medications Ordered in ED Medications - No data to display   Initial Impression / Assessment and Plan / ED Course  I have reviewed the triage vital signs and the nursing notes.  Pertinent labs & imaging results that were available during my care of the patient were reviewed by me and considered in my medical decision making (see chart for details).     Pt advised to follow up with Orthopaedist,  Cam walker,   Final Clinical Impressions(s) / ED Diagnoses   Final diagnoses:  Acute right ankle pain  New Prescriptions Discharge Medication List as of 11/26/2016 11:33 AM     Meds ordered this encounter  Medications  . DISCONTD: ibuprofen (ADVIL,MOTRIN) 200 MG tablet    Sig: Take 600 mg by mouth every 6 (six) hours as needed for mild pain.  Marland Kitchen DISCONTD: naproxen sodium (ALEVE) 220 MG tablet    Sig: Take 660 mg by mouth daily as needed.  Marland Kitchen ibuprofen (ADVIL,MOTRIN) 800 MG tablet    Sig: Take 1 tablet (800 mg total) by mouth 3 (three) times daily.    Dispense:  21 tablet    Refill:  0    Order Specific Question:   Supervising Provider    Answer:   Eber Hong [[id number]]   An After Visit  Summary was printed and given to the patient.    Elson Areas, PA-C 11/26/16 1752    Bethann Berkshire, MD 11/27/16 [phone]

## 2016-11-27 ENCOUNTER — Telehealth: Payer: Self-pay | Admitting: Family Medicine

## 2016-11-27 DIAGNOSIS — M25571 Pain in right ankle and joints of right foot: Secondary | ICD-10-CM

## 2016-11-27 NOTE — Telephone Encounter (Signed)
Pt left vm for referrals on 11/25/16 requesting referral to see a specialist for his ankle. He said he came into our office and did x-rays but determined nothing else needed to be done at the time. The patient said he is not feeling any better and wanted a specialist to look at his ankle. Please advise. Pt can be reached at [phone].

## 2016-11-27 NOTE — Telephone Encounter (Signed)
Sure. I placed a referral. Return if acute worsening prior to that appointment

## 2017-06-23 ENCOUNTER — Other Ambulatory Visit: Payer: Self-pay | Admitting: Family Medicine

## 2017-06-23 DIAGNOSIS — L237 Allergic contact dermatitis due to plants, except food: Secondary | ICD-10-CM

## 2017-10-15 ENCOUNTER — Ambulatory Visit: Payer: Self-pay | Admitting: Family Medicine

## 2017-10-15 ENCOUNTER — Encounter: Payer: Self-pay | Admitting: Family Medicine

## 2017-10-15 ENCOUNTER — Other Ambulatory Visit: Payer: Self-pay

## 2017-10-15 VITALS — BP 146/84 | HR 89 | Temp 97.6°F | Ht 70.0 in

## 2017-10-15 DIAGNOSIS — Z131 Encounter for screening for diabetes mellitus: Secondary | ICD-10-CM

## 2017-10-15 DIAGNOSIS — M25572 Pain in left ankle and joints of left foot: Secondary | ICD-10-CM

## 2017-10-15 DIAGNOSIS — M109 Gout, unspecified: Secondary | ICD-10-CM

## 2017-10-15 LAB — GLUCOSE, POCT (MANUAL RESULT ENTRY): POC Glucose: 101 mg/dl — AB (ref 70–99)

## 2017-10-15 MED ORDER — TRAMADOL HCL 50 MG PO TABS
50.0000 mg | ORAL_TABLET | Freq: Four times a day (QID) | ORAL | 0 refills | Status: DC | PRN
Start: 2017-10-15 — End: 2017-10-23

## 2017-10-15 MED ORDER — PREDNISONE 20 MG PO TABS: ORAL_TABLET | ORAL | 0 refills | Status: DC

## 2017-10-15 NOTE — Progress Notes (Signed)
Subjective:  By signing my name below, I, Alex Gordon, attest that this documentation has been prepared under the direction and in the presence of Alex Staggers, MD. Electronically Signed: Stann Gordon, Scribe. 10/15/2017 , 5:04 PM .  Patient was seen in Room 3 .   Patient ID: Alex Gordon, male    DOB: Oct 02, 1975, 42 y.o.   MRN: [id number] Chief Complaint  Patient presents with  . left foot pain    started last week    HPI Alex Gordon is a 42 y.o. male  Patient states he's been having left foot pain that started about 1 week ago. He notes the pain came out of the blue, and denies any known injuries or falls. He works in [employer] but he wasn't working at that time. He reports being seen at another clinic 1 week ago when it first started, and was prescribed indomethacin. He took it for 2 days but without relief. He denies any abdominal issues while on the indomethacin. He had blood work done too, and uric acid was normal. When he walks, he describes a stinging pain under his left foot. He denies any other swelling.   He was previously seen for right ankle pain in Oct 2018. Since then, patient mentions he's changed his diet: stopped eating red meats, been eating chicken, fish and vegetables. He drinks an occasional beer, but denies any prior to flare. He did have a positive uric acid of 7.9 in 2015. He denies history of seizures.   There are no active problems to display for this patient.  No past medical history on file. No past surgical history on file. No Known Allergies Prior to Admission medications   Medication Sig Start Date End Date Taking? Authorizing Provider  indomethacin (INDOCIN) 50 MG capsule Take 50 mg by mouth 2 (two) times daily with a meal.   Yes [provider]   Social History   Socioeconomic History  . Marital status: Married    Spouse name: Not on file  . Number of children: Not on file  . Years of education: Not on file  . Highest  education level: Not on file  Occupational History  . Not on file  Social Needs  . Financial resource strain: Not on file  . Food insecurity:    Worry: Not on file    Inability: Not on file  . Transportation needs:    Medical: Not on file    Non-medical: Not on file  Tobacco Use  . Smoking status: Light Tobacco Smoker  . Smokeless tobacco: Never Used  Substance and Sexual Activity  . Alcohol use: Yes    Alcohol/week: 12.0 standard drinks    Types: 12 Cans of beer per week  . Drug use: No  . Sexual activity: Yes  Lifestyle  . Physical activity:    Days per week: Not on file    Minutes per session: Not on file  . Stress: Not on file  Relationships  . Social connections:    Talks on phone: Not on file    Gets together: Not on file    Attends religious service: Not on file    Active member of club or organization: Not on file    Attends meetings of clubs or organizations: Not on file    Relationship status: Not on file  . Intimate partner violence:    Fear of current or ex partner: Not on file    Emotionally abused: Not on file  Physically abused: Not on file    Forced sexual activity: Not on file  Other Topics Concern  . Not on file  Social History Narrative  . Not on file   Review of Systems  Constitutional: Negative for fatigue and unexpected weight change.  Eyes: Negative for visual disturbance.  Respiratory: Negative for cough, chest tightness and shortness of breath.   Cardiovascular: Negative for chest pain, palpitations and leg swelling.  Gastrointestinal: Negative for abdominal pain and blood in stool.  Musculoskeletal: Positive for gait problem, joint swelling and myalgias (left foot).  Neurological: Negative for dizziness, weakness, light-headedness and headaches.       Objective:   Physical Exam  Constitutional: He is oriented to person, place, and time. He appears well-developed and well-nourished. No distress.  HENT:  Head: Normocephalic and  atraumatic.  Eyes: Pupils are equal, round, and reactive to light. EOM are normal.  Neck: Neck supple.  Cardiovascular: Normal rate.  Pulmonary/Chest: Effort normal. No respiratory distress.  Musculoskeletal: Normal range of motion.  Left ankle: no bony tenderness, has diffuse soft tissue swelling throughout mid foot, no wounds, skin intact, slight warmth and erythema dorsally, focal tenderness of first MTP on the left, tender mid foot 3rd-4th metacarpals to anterior foot  Neurological: He is alert and oriented to person, place, and time.  Skin: Skin is warm and dry.  Psychiatric: He has a normal mood and affect. His behavior is normal.  Nursing note and vitals reviewed.   Vitals:   10/15/17 1611  BP: (!) 146/84  Pulse: 89  Temp: 97.6 F (36.4 C)  TempSrc: Oral  SpO2: 95%  Height: 5\' 10"  (1.778 m)   Results for orders placed or performed in visit on 10/15/17  POCT glucose (manual entry)  Result Value Ref Range   POC Glucose 101 (A) 70 - 99 mg/dl       Assessment & Plan:    Keenen Roessner is a 42 y.o. male Acute gout of left foot, unspecified cause - Plan: predniSONE (DELTASONE) 20 MG tablet  Pain of joint of left ankle and foot - Plan: traMADol (ULTRAM) 50 MG tablet  Screening for diabetes mellitus - Plan: POCT glucose (manual entry)  Prior elevated uric acid, suspected gout  flare.  1 week of symptoms without improvement with indomethacin,  -will treat with prednisone.  Potential side effects discussed.    -Tramadol if needed for breakthrough pain.    -Recent uric acid testing may not have been the middle of gout flare.  We will plan to recheck in 3 to 4 weeks.  - rtc precautions.   Meds ordered this encounter  Medications  . predniSONE (DELTASONE) 20 MG tablet    Sig: 3 by mouth for 3 days, then 2 by mouth for 2 days, then 1 by mouth for 2 days, then 1/2 by mouth for 2 days.    Dispense:  16 tablet    Refill:  0  . traMADol (ULTRAM) 50 MG tablet    Sig: Take 1  tablet (50 mg total) by mouth every 6 (six) hours as needed.    Dispense:  15 tablet    Refill:  0   Patient Instructions     Current foot pain and swelling appears to be due to gout.  Start prednisone, tramadol if needed for pain, recheck in the next 3 to 4 weeks for repeat gout testing but if symptoms are not improving within the next 1 week, return for recheck.  Sooner if worsening.  Gout Gout is painful swelling that can occur in some of your joints. Gout is a type of arthritis. This condition is caused by having too much uric acid in your body. Uric acid is a chemical that forms when your body breaks down substances called purines. Purines are important for building body proteins. When your body has too much uric acid, sharp crystals can form and build up inside your joints. This causes pain and swelling. Gout attacks can happen quickly and be very painful (acute gout). Over time, the attacks can affect more joints and become more frequent (chronic gout). Gout can also cause uric acid to build up under your skin and inside your kidneys. What are the causes? This condition is caused by too much uric acid in your blood. This can occur because:  Your kidneys do not remove enough uric acid from your blood. This is the most common cause.  Your body makes too much uric acid. This can occur with some cancers and cancer treatments. It can also occur if your body is breaking down too many red blood cells (hemolytic anemia).  You eat too many foods that are high in purines. These foods include organ meats and some seafood. Alcohol, especially beer, is also high in purines.  A gout attack may be triggered by trauma or stress. What increases the risk? This condition is more likely to develop in people who:  Have a family history of gout.  Are male and middle-aged.  Are male and have gone through menopause.  Are obese.  Frequently drink alcohol, especially beer.  Are  dehydrated.  Lose weight too quickly.  Have an organ transplant.  Have lead poisoning.  Take certain medicines, including aspirin, cyclosporine, diuretics, levodopa, and niacin.  Have kidney disease or psoriasis.  What are the signs or symptoms? An attack of acute gout happens quickly. It usually occurs in just one joint. The most common place is the big toe. Attacks often start at night. Other joints that may be affected include joints of the feet, ankle, knee, fingers, wrist, or elbow. Symptoms may include:  Severe pain.  Warmth.  Swelling.  Stiffness.  Tenderness. The affected joint may be very painful to touch.  Shiny, red, or purple skin.  Chills and fever.  Chronic gout may cause symptoms more frequently. More joints may be involved. You may also have white or yellow lumps (tophi) on your hands or feet or in other areas near your joints. How is this diagnosed? This condition is diagnosed based on your symptoms, medical history, and physical exam. You may have tests, such as:  Blood tests to measure uric acid levels.  Removal of joint fluid with a needle (aspiration) to look for uric acid crystals.  X-rays to look for joint damage.  How is this treated? Treatment for this condition has two phases: treating an acute attack and preventing future attacks. Acute gout treatment may include medicines to reduce pain and swelling, including:  NSAIDs.  Steroids. These are strong anti-inflammatory medicines that can be taken by mouth (orally) or injected into a joint.  Colchicine. This medicine relieves pain and swelling when it is taken soon after an attack. It can be given orally or through an IV tube.  Preventive treatment may include:  Daily use of smaller doses of NSAIDs or colchicine.  Use of a medicine that reduces uric acid levels in your blood.  Changes to your diet. You may need to see a specialist about healthy eating (dietitian).  Follow these  instructions at home: During a Gout Attack  If directed, apply ice to the affected area: ? Put ice in a plastic bag. ? Place a towel between your skin and the bag. ? Leave the ice on for 20 minutes, 2-3 times a day.  Rest the joint as much as possible. If the affected joint is in your leg, you may be given crutches to use.  Raise (elevate) the affected joint above the level of your heart as often as possible.  Drink enough fluids to keep your urine clear or pale yellow.  Take over-the-counter and prescription medicines only as told by your health care provider.  Do not drive or operate heavy machinery while taking prescription pain medicine.  Follow instructions from your health care provider about eating or drinking restrictions.  Return to your normal activities as told by your health care provider. Ask your health care provider what activities are safe for you. Avoiding Future Gout Attacks  Follow a low-purine diet as told by your dietitian or health care provider. Avoid foods and drinks that are high in purines, including liver, kidney, anchovies, asparagus, herring, mushrooms, mussels, and beer.  Limit alcohol intake to no more than 1 drink a day for nonpregnant women and 2 drinks a day for men. One drink equals 12 oz of beer, 5 oz of wine, or 1 oz of hard liquor.  Maintain a healthy weight or lose weight if you are overweight. If you want to lose weight, talk with your health care provider. It is important that you do not lose weight too quickly.  Start or maintain an exercise program as told by your health care provider.  Drink enough fluids to keep your urine clear or pale yellow.  Take over-the-counter and prescription medicines only as told by your health care provider.  Keep all follow-up visits as told by your health care provider. This is important. Contact a health care provider if:  You have another gout attack.  You continue to have symptoms of a gout attack  after10 days of treatment.  You have side effects from your medicines.  You have chills or a fever.  You have burning pain when you urinate.  You have pain in your lower back or belly. Get help right away if:  You have severe or uncontrolled pain.  You cannot urinate. This information is not intended to replace advice given to you by your health care provider. Make sure you discuss any questions you have with your health care provider. Document Released: 01/10/2000 Document Revised: 06/20/2015 Document Reviewed: 10/25/2014 Elsevier Interactive Patient Education  [employer]2018 [employer]   If you have lab work done today you will be contacted with your lab results within the next 2 weeks.  If you have not heard from us then please contact us. The fastest way to get your results is to register for My Chart.   IF you received an x-ray today, you will receive an invoice from [facility]. Please contact [facility] at [phone] with questions or concerns regarding your invoice.   IF you received labwork today, you will receive an invoice from [facility]. Please contact [facility] at [phone] with questions or concerns regarding your invoice.   Our billing staff will not be able to assist you with questions regarding bills from these companies.  You will be contacted with the lab results as soon as they are available. The fastest way to get your results is to activate your My Chart  account. Instructions are located on the last page of this paperwork. If you have not heard from [place] regarding the results in 2 weeks, please contact this office.       I personally performed the services described in this documentation, which was scribed in my presence. The recorded information has been reviewed and considered for accuracy and completeness, addended by me as needed, and agree with information above.  Signed,   Alex Staggers, MD [facility].  10/15/17 5:27 PM

## 2017-10-15 NOTE — Patient Instructions (Addendum)
Current foot pain and swelling appears to be due to gout.  Start prednisone, tramadol if needed for pain, recheck in the next 3 to 4 weeks for repeat gout testing but if symptoms are not improving within the next 1 week, return for recheck.  Sooner if worsening.   Gout Gout is painful swelling that can occur in some of your joints. Gout is a type of arthritis. This condition is caused by having too much uric acid in your body. Uric acid is a chemical that forms when your body breaks down substances called purines. Purines are important for building body proteins. When your body has too much uric acid, sharp crystals can form and build up inside your joints. This causes pain and swelling. Gout attacks can happen quickly and be very painful (acute gout). Over time, the attacks can affect more joints and become more frequent (chronic gout). Gout can also cause uric acid to build up under your skin and inside your kidneys. What are the causes? This condition is caused by too much uric acid in your blood. This can occur because:  Your kidneys do not remove enough uric acid from your blood. This is the most common cause.  Your body makes too much uric acid. This can occur with some cancers and cancer treatments. It can also occur if your body is breaking down too many red blood cells (hemolytic anemia).  You eat too many foods that are high in purines. These foods include organ meats and some seafood. Alcohol, especially beer, is also high in purines.  A gout attack may be triggered by trauma or stress. What increases the risk? This condition is more likely to develop in people who:  Have a family history of gout.  Are male and middle-aged.  Are male and have gone through menopause.  Are obese.  Frequently drink alcohol, especially beer.  Are dehydrated.  Lose weight too quickly.  Have an organ transplant.  Have lead poisoning.  Take certain medicines, including aspirin,  cyclosporine, diuretics, levodopa, and niacin.  Have kidney disease or psoriasis.  What are the signs or symptoms? An attack of acute gout happens quickly. It usually occurs in just one joint. The most common place is the big toe. Attacks often start at night. Other joints that may be affected include joints of the feet, ankle, knee, fingers, wrist, or elbow. Symptoms may include:  Severe pain.  Warmth.  Swelling.  Stiffness.  Tenderness. The affected joint may be very painful to touch.  Shiny, red, or purple skin.  Chills and fever.  Chronic gout may cause symptoms more frequently. More joints may be involved. You may also have white or yellow lumps (tophi) on your hands or feet or in other areas near your joints. How is this diagnosed? This condition is diagnosed based on your symptoms, medical history, and physical exam. You may have tests, such as:  Blood tests to measure uric acid levels.  Removal of joint fluid with a needle (aspiration) to look for uric acid crystals.  X-rays to look for joint damage.  How is this treated? Treatment for this condition has two phases: treating an acute attack and preventing future attacks. Acute gout treatment may include medicines to reduce pain and swelling, including:  NSAIDs.  Steroids. These are strong anti-inflammatory medicines that can be taken by mouth (orally) or injected into a joint.  Colchicine. This medicine relieves pain and swelling when it is taken soon after an attack. It can  be given orally or through an IV tube.  Preventive treatment may include:  Daily use of smaller doses of NSAIDs or colchicine.  Use of a medicine that reduces uric acid levels in your blood.  Changes to your diet. You may need to see a specialist about healthy eating (dietitian).  Follow these instructions at home: During a Gout Attack  If directed, apply ice to the affected area: ? Put ice in a plastic bag. ? Place a towel between  your skin and the bag. ? Leave the ice on for 20 minutes, 2-3 times a day.  Rest the joint as much as possible. If the affected joint is in your leg, you may be given crutches to use.  Raise (elevate) the affected joint above the level of your heart as often as possible.  Drink enough fluids to keep your urine clear or pale yellow.  Take over-the-counter and prescription medicines only as told by your health care provider.  Do not drive or operate heavy machinery while taking prescription pain medicine.  Follow instructions from your health care provider about eating or drinking restrictions.  Return to your normal activities as told by your health care provider. Ask your health care provider what activities are safe for you. Avoiding Future Gout Attacks  Follow a low-purine diet as told by your dietitian or health care provider. Avoid foods and drinks that are high in purines, including liver, kidney, anchovies, asparagus, herring, mushrooms, mussels, and beer.  Limit alcohol intake to no more than 1 drink a day for nonpregnant women and 2 drinks a day for men. One drink equals 12 oz of beer, 5 oz of wine, or 1 oz of hard liquor.  Maintain a healthy weight or lose weight if you are overweight. If you want to lose weight, talk with your health care provider. It is important that you do not lose weight too quickly.  Start or maintain an exercise program as told by your health care provider.  Drink enough fluids to keep your urine clear or pale yellow.  Take over-the-counter and prescription medicines only as told by your health care provider.  Keep all follow-up visits as told by your health care provider. This is important. Contact a health care provider if:  You have another gout attack.  You continue to have symptoms of a gout attack after10 days of treatment.  You have side effects from your medicines.  You have chills or a fever.  You have burning pain when you  urinate.  You have pain in your lower back or belly. Get help right away if:  You have severe or uncontrolled pain.  You cannot urinate. This information is not intended to replace advice given to you by your health care provider. Make sure you discuss any questions you have with your health care provider. Document Released: 01/10/2000 Document Revised: 06/20/2015 Document Reviewed: 10/25/2014 Elsevier Interactive Patient Education  [employer]2018 Elsevier Inc.   If you have lab work done today you will be contacted with your lab results within the next 2 weeks.  If you have not heard from us then please contact us. The fastest way to get your results is to register for My Chart.   IF you received an x-ray today, you will receive an invoice from [facility]. Please contact [facility] at [phone] with questions or concerns regarding your invoice.   IF you received labwork today, you will receive an invoice from [facility]. Please contact [facility] at [phone] with  questions or concerns regarding your invoice.   Our billing staff will not be able to assist you with questions regarding bills from these companies.  You will be contacted with the lab results as soon as they are available. The fastest way to get your results is to activate your My Chart account. Instructions are located on the last page of this paperwork. If you have not heard from us regarding the results in 2 weeks, please contact this office.      

## 2017-10-22 ENCOUNTER — Telehealth: Payer: Self-pay | Admitting: Family Medicine

## 2017-10-22 DIAGNOSIS — M25572 Pain in left ankle and joints of left foot: Secondary | ICD-10-CM

## 2017-10-22 NOTE — Telephone Encounter (Signed)
Copied from CRM [id number]. Topic: Quick Communication - Rx Refill/Question >> Oct 22, 2017 11:19 AM Alexander Bergeron B wrote: Pt ran out and still has pain; pt called to inquire for a refill on medication   Medication: predniSONE (DELTASONE) 20 MG tablet [[id number]]  traMADol (ULTRAM) 50 MG tablet [[id number]]   Has the patient contacted their pharmacy? Yes.   (Agent: If no, request that the patient contact the pharmacy for the refill.) (Agent: If yes, when and what did the pharmacy advise?)  Preferred Pharmacy (with phone number or street name): [facility]  Agent: Please be advised that RX refills may take up to 3 business days. We ask that you follow-up with your pharmacy.

## 2017-10-23 MED ORDER — TRAMADOL HCL 50 MG PO TABS: 50.0000 mg | ORAL_TABLET | Freq: Four times a day (QID) | ORAL | 0 refills | Status: DC | PRN

## 2017-10-23 NOTE — Telephone Encounter (Signed)
Should still be on initial prednisone taper, so refill not placed.  I did refill tramadol temporarily.  If symptoms persist, or need for further medication, please return for recheck.

## 2017-10-24 ENCOUNTER — Other Ambulatory Visit: Payer: Self-pay | Admitting: Family Medicine

## 2017-10-24 DIAGNOSIS — M109 Gout, unspecified: Secondary | ICD-10-CM

## 2017-10-25 NOTE — Telephone Encounter (Signed)
Noted  

## 2017-10-25 NOTE — Telephone Encounter (Signed)
This note from Dr. Neva Seat came to [facility] instead of his CMA/flow coordinator.

## 2017-10-28 ENCOUNTER — Ambulatory Visit: Payer: Self-pay | Admitting: Emergency Medicine

## 2017-10-28 ENCOUNTER — Encounter: Payer: Self-pay | Admitting: Emergency Medicine

## 2017-10-28 VITALS — BP 118/72 | HR 69 | Temp 98.4°F | Resp 18

## 2017-10-28 DIAGNOSIS — M25572 Pain in left ankle and joints of left foot: Secondary | ICD-10-CM

## 2017-10-28 MED ORDER — PREDNISONE 20 MG PO TABS: 40.0000 mg | ORAL_TABLET | Freq: Every day | ORAL | 0 refills | Status: AC

## 2017-10-28 MED ORDER — TRAMADOL HCL 50 MG PO TABS: 50.0000 mg | ORAL_TABLET | Freq: Four times a day (QID) | ORAL | 0 refills | Status: AC | PRN

## 2017-10-28 NOTE — Patient Instructions (Addendum)
     If you have lab work done today you will be contacted with your lab results within the next 2 weeks.  If you have not heard from us then please contact us. The fastest way to get your results is to register for My Chart.   IF you received an x-ray today, you will receive an invoice from [facility]. Please contact [facility] at [phone] with questions or concerns regarding your invoice.   IF you received labwork today, you will receive an invoice from [facility]. Please contact [facility] at [phone] with questions or concerns regarding your invoice.   Our billing staff will not be able to assist you with questions regarding bills from these companies.  You will be contacted with the lab results as soon as they are available. The fastest way to get your results is to activate your My Chart account. Instructions are located on the last page of this paperwork. If you have not heard from us regarding the results in 2 weeks, please contact this office.     Dolor del pie (Foot Pain) El dolor del pie puede tener muchas causas. Algunas causas frecuentes son las siguientes:  Una lesin.  Un esguince.  Artritis.  Ampollas.  Juanetes. INSTRUCCIONES PARA EL CUIDADO EN EL HOGAR Est atento a cualquier cambio en los sntomas. Tome estas medidas para aliviar las molestias:  Si se lo indican, aplique hielo sobre la zona afectada: ? Ponga el hielo en una bolsa plstica. ? Coloque una toalla entre la piel y la bolsa de hielo. ? Deje el hielo durante 15 a 20 minutos, 3 a 4veces por da, durante 2das.  Tome los medicamentos de venta libre y los recetados solamente como se lo haya indicado el mdico.  Use zapatos cmodos y anatmicos que tengan buen calce. No use zapatos con tacones altos.  No permanezca de pie ni camine durante largos perodos.  No levante mucho peso. Esto puede agregar presin en el pie.  Haga ejercicios de estiramiento para aliviar el dolor  del pie y la rigidez como se lo haya indicado el mdico.  Hgase masajes suaves en el pie.  Mantenga los pies, limpios y secos. SOLICITE ATENCIN MDICA SI:  El dolor no mejora despus de unos das de cuidados personales.  El dolor empeora.  No puede apoyar el peso en el pie. SOLICITE ATENCIN MDICA DE INMEDIATO SI:  El pie se le adormece o tiene hormigueo.  El pie o los dedos de ese pie se le hinchan.  El pie o los dedos de ese pie se tornan de color blanco o azul.  Tiene el pie enrojecido y caliente al tacto. Esta informacin no tiene como fin reemplazar el consejo del mdico. Asegrese de hacerle al mdico cualquier pregunta que tenga. Document Released: 05/06/2015 Document Revised: 05/06/2015 Document Reviewed: 02/07/2014 Elsevier Interactive Patient Education  2018 Elsevier Inc.  

## 2017-10-28 NOTE — Progress Notes (Signed)
Alex Gordon 42 y.o.   Chief Complaint  Patient presents with  . Foot Pain    left foot pain; seen x1 week ago for same issue; pain is back    HISTORY OF PRESENT ILLNESS: This is a 42 y.o. male complaining of left foot pain for 1 to 2 weeks.  Recurrent problem.  Questionable gout.  Medications helped but now pain is back.  No new symptoms.  HPI   Prior to Admission medications   Medication Sig Start Date End Date Taking? Authorizing Provider  traMADol (ULTRAM) 50 MG tablet Take 1 tablet (50 mg total) by mouth every 6 (six) hours as needed. 10/23/17  Yes Shade Flood, MD  indomethacin (INDOCIN) 50 MG capsule Take 50 mg by mouth 2 (two) times daily with a meal.    [provider]    No Known Allergies  There are no active problems to display for this patient.   No past medical history on file.  No past surgical history on file.  Social History   Socioeconomic History  . Marital status: Married    Spouse name: Not on file  . Number of children: Not on file  . Years of education: Not on file  . Highest education level: Not on file  Occupational History  . Not on file  Social Needs  . Financial resource strain: Not on file  . Food insecurity:    Worry: Not on file    Inability: Not on file  . Transportation needs:    Medical: Not on file    Non-medical: Not on file  Tobacco Use  . Smoking status: Light Tobacco Smoker  . Smokeless tobacco: Never Used  Substance and Sexual Activity  . Alcohol use: Yes    Alcohol/week: 12.0 standard drinks    Types: 12 Cans of beer per week  . Drug use: No  . Sexual activity: Yes  Lifestyle  . Physical activity:    Days per week: Not on file    Minutes per session: Not on file  . Stress: Not on file  Relationships  . Social connections:    Talks on phone: Not on file    Gets together: Not on file    Attends religious service: Not on file    Active member of club or organization: Not on file    Attends  meetings of clubs or organizations: Not on file    Relationship status: Not on file  . Intimate partner violence:    Fear of current or ex partner: Not on file    Emotionally abused: Not on file    Physically abused: Not on file    Forced sexual activity: Not on file  Other Topics Concern  . Not on file  Social History Narrative  . Not on file    Family History  Problem Relation Age of Onset  . Diabetes Mother      Review of Systems  Constitutional: Negative.  Negative for chills and fever.  Gastrointestinal: Negative for nausea and vomiting.  Musculoskeletal:       Foot pain  Skin: Negative.  Negative for rash.  Neurological: Negative for dizziness and headaches.  All other systems reviewed and are negative.  Vitals:   10/28/17 1039  BP: 118/72  Pulse: 69  Resp: 18  Temp: 98.4 F (36.9 C)  SpO2: 97%     Physical Exam  Constitutional: He is oriented to person, place, and time. He appears well-developed and well-nourished.  HENT:  Head: Normocephalic and atraumatic.  Eyes: Pupils are equal, round, and reactive to light. EOM are normal.  Neck: Normal range of motion.  Cardiovascular: Normal rate.  Pulmonary/Chest: Effort normal.  Musculoskeletal:  Left foot: No erythema or swelling.  Some tenderness to the first MTP joint.  Ankle shows full range of motion with no erythema or swelling.  Some tenderness to lateral side of foot.  NVI.  Neurological: He is alert and oriented to person, place, and time.  Skin: Skin is warm and dry. Capillary refill takes less than 2 seconds.  Psychiatric: He has a normal mood and affect. His behavior is normal.  Vitals reviewed.    ASSESSMENT & PLAN: Alex Gordon was seen today for foot pain.  Diagnoses and all orders for this visit:  Pain of joint of left ankle and foot -     Ambulatory referral to Podiatry -     predniSONE (DELTASONE) 20 MG tablet; Take 2 tablets (40 mg total) by mouth daily with breakfast for 5 days. -      traMADol (ULTRAM) 50 MG tablet; Take 1 tablet (50 mg total) by mouth every 6 (six) hours as needed.   Questionable gout  Patient Instructions       If you have lab work done today you will be contacted with your lab results within the next 2 weeks.  If you have not heard from [place] then please contact us. The fastest way to get your results is to register for My Chart.   IF you received an x-ray today, you will receive an invoice from [facility]. Please contact [facility] at [phone] with questions or concerns regarding your invoice.   IF you received labwork today, you will receive an invoice from [place]. Please contact [facility] at [phone] with questions or concerns regarding your invoice.   Our billing staff will not be able to assist you with questions regarding bills from these companies.  You will be contacted with the lab results as soon as they are available. The fastest way to get your results is to activate your My Chart account. Instructions are located on the last page of this paperwork. If you have not heard from [place] regarding the results in 2 weeks, please contact this office.      Dolor del pie (Foot Pain) El dolor del pie puede tener muchas causas. Algunas causas frecuentes son las siguientes:  Una lesin.  Un esguince.  Artritis.  Ampollas.  Juanetes. INSTRUCCIONES PARA EL CUIDADO EN EL HOGAR Est atento a cualquier cambio en los sntomas. Tome estas medidas para aliviar las molestias:  Si se lo indican, aplique hielo sobre la zona afectada: ? Ponga el hielo en una bolsa plstica. ? Coloque una [employer] piel y la bolsa de hielo. ? Deje el hielo durante 15 a 20 minutos, 3 a 4veces por da, durante 2das.  Tome los medicamentos de venta libre y los recetados solamente como se lo haya indicado el mdico.  Use zapatos cmodos y anatmicos que tengan buen calce. No use zapatos con tacones altos.  No permanezca de pie ni  camine durante largos perodos.  No levante mucho peso. Esto puede agregar presin en el pie.  Haga ejercicios de estiramiento para Engineer, materials del pie y la rigidez como se lo haya indicado el mdico.  Hgase masajes suaves en el pie.  Mantenga los pies, limpios y secos. SOLICITE ATENCIN MDICA SI:  El dolor no mejora despus de [address] de [address]  personales.  El dolor [place].  No puede apoyar el peso en el pie. SOLICITE ATENCIN MDICA DE INMEDIATO SI:  El pie se le adormece o tiene hormigueo.  El pie o los dedos de ese pie se le hinchan.  El pie o los dedos de ese pie se tornan de color blanco o [place].  Tiene el pie enrojecido y caliente al tacto. Esta informacin no tiene Theme park manager el consejo del mdico. Asegrese de hacerle al mdico cualquier pregunta que tenga. Document Released: 05/06/2015 Document Revised: 05/06/2015 Document Reviewed: 02/07/2014 Elsevier Interactive Patient Education  2018 Elsevier Inc.      Edwina Barth, MD [facility]

## 2017-11-17 ENCOUNTER — Ambulatory Visit: Payer: Self-pay | Admitting: Family Medicine

## 2017-11-18 ENCOUNTER — Ambulatory Visit (INDEPENDENT_AMBULATORY_CARE_PROVIDER_SITE_OTHER): Payer: Self-pay

## 2017-11-18 ENCOUNTER — Ambulatory Visit (INDEPENDENT_AMBULATORY_CARE_PROVIDER_SITE_OTHER): Payer: Self-pay | Admitting: Podiatry

## 2017-11-18 DIAGNOSIS — M25371 Other instability, right ankle: Secondary | ICD-10-CM

## 2017-11-18 DIAGNOSIS — M779 Enthesopathy, unspecified: Secondary | ICD-10-CM

## 2017-11-18 DIAGNOSIS — M778 Other enthesopathies, not elsewhere classified: Secondary | ICD-10-CM

## 2017-11-18 DIAGNOSIS — M25471 Effusion, right ankle: Secondary | ICD-10-CM

## 2017-11-18 DIAGNOSIS — M25571 Pain in right ankle and joints of right foot: Secondary | ICD-10-CM

## 2017-11-18 MED ORDER — MELOXICAM 15 MG PO TABS: 15.0000 mg | ORAL_TABLET | Freq: Every day | ORAL | 0 refills | Status: AC

## 2017-11-18 MED ORDER — CELECOXIB 200 MG PO CAPS: 200.0000 mg | ORAL_CAPSULE | Freq: Two times a day (BID) | ORAL | 0 refills | Status: AC

## 2017-12-25 NOTE — Progress Notes (Signed)
  Subjective:  Patient ID: Alex Gordon, male    DOB: [birth year],  MRN: [id number]  Chief Complaint  Patient presents with  . Foot Pain    Pt states pain in both feet, but pain alternates between feet, with one sometimes hurting while another is fine. Right ankle was twisted 1 yr ago and pt spent 4 months on crutches, pain began with this injury and hasn't left. Left foot pain began 2 1/2 months ago. Pain is a sharp stabbing pain in the bottom of the heel, but pt also has occasionally swelling in both ankles on both sides, as well as pain on the top of both feet.    42 y.o. male presents with the above complaint. History confirmed with patient.   Review of Systems: Negative except as noted in the HPI. Denies N/V/F/Ch.  No past medical history on file.  Current Outpatient Medications:  .  indomethacin (INDOCIN) 50 MG capsule, Take 50 mg by mouth 2 (two) times daily with a meal., Disp: , Rfl:  .  traMADol (ULTRAM) 50 MG tablet, Take 1 tablet (50 mg total) by mouth every 6 (six) hours as needed., Disp: 15 tablet, Rfl: 0 .  celecoxib (CELEBREX) 200 MG capsule, Take 1 capsule (200 mg total) by mouth 2 (two) times daily., Disp: 60 capsule, Rfl: 0 .  meloxicam (MOBIC) 15 MG tablet, Take 1 tablet (15 mg total) by mouth daily., Disp: 30 tablet, Rfl: 0  Social History   Tobacco Use  Smoking Status Light Tobacco Smoker  Smokeless Tobacco Never Used    No Known Allergies Objective:  There were no vitals filed for this visit. There is no height or weight on file to calculate BMI. Constitutional Well developed. Well nourished.  Vascular Dorsalis pedis pulses palpable bilaterally. Posterior tibial pulses palpable bilaterally. Capillary refill normal to all digits.  No cyanosis or clubbing noted. Pedal hair growth normal.  Neurologic Normal speech. Oriented to person, place, and time. Epicritic sensation to light touch grossly present bilaterally.  Dermatologic Nails well groomed and  normal in appearance. No open wounds. No skin lesions.  Orthopedic: Normal joint ROM without pain or crepitus bilaterally. No visible deformities. POP R ATFL right POP bilateral heels   Radiographs: Taken and reviewed no acute fractures or dislocations. Assessment:   1. Capsulitis of foot, left   2. Capsulitis of foot, right   3. Ankle instability, right   4. Pain and swelling of right ankle    Plan:  Patient was evaluated and treated and all questions answered.  Ankle Instability R -Rx Celebrex -Ankle brace dispensed -Refer to PT  Return in about 6 weeks (around 12/30/2017) for Ankle Instability f/u.

## 2017-12-30 ENCOUNTER — Ambulatory Visit: Payer: Self-pay | Admitting: Podiatry

## 2018-01-20 ENCOUNTER — Ambulatory Visit: Payer: Self-pay | Admitting: Podiatry
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
## 2021-09-09 DIAGNOSIS — M79661 Pain in right lower leg: Secondary | ICD-10-CM | POA: Insufficient documentation

## 2021-09-09 DIAGNOSIS — M25561 Pain in right knee: Secondary | ICD-10-CM | POA: Insufficient documentation

## 2022-02-04 ENCOUNTER — Other Ambulatory Visit: Payer: Self-pay | Admitting: Nurse Practitioner

## 2022-02-04 DIAGNOSIS — N644 Mastodynia: Secondary | ICD-10-CM

## 2022-04-14 ENCOUNTER — Ambulatory Visit
Admission: RE | Admit: 2022-04-14 | Discharge: 2022-04-14 | Disposition: A | Discharge status: Home / Self Care | Payer: Self-pay | Source: Ambulatory Visit | Attending: Nurse Practitioner | Admitting: Nurse Practitioner

## 2022-04-14 ENCOUNTER — Ambulatory Visit
Admission: RE | Admit: 2022-04-14 | Discharge: 2022-04-14 | Disposition: A | Discharge status: Home / Self Care | Payer: 59 | Source: Ambulatory Visit | Attending: Nurse Practitioner | Admitting: Nurse Practitioner

## 2022-04-14 DIAGNOSIS — N644 Mastodynia: Secondary | ICD-10-CM

## 2023-04-22 DIAGNOSIS — M25562 Pain in left knee: Secondary | ICD-10-CM | POA: Insufficient documentation

## 2023-04-24 ENCOUNTER — Emergency Department (HOSPITAL_COMMUNITY)
Admission: EM | Admit: 2023-04-24 | Discharge: 2023-04-24 | Disposition: A | Discharge status: Home / Self Care | Payer: Self-pay | Attending: Emergency Medicine | Admitting: Emergency Medicine

## 2023-04-24 ENCOUNTER — Other Ambulatory Visit: Payer: Self-pay

## 2023-04-24 ENCOUNTER — Encounter (HOSPITAL_COMMUNITY): Payer: Self-pay

## 2023-04-24 DIAGNOSIS — Y9389 Activity, other specified: Secondary | ICD-10-CM | POA: Insufficient documentation

## 2023-04-24 DIAGNOSIS — M7042 Prepatellar bursitis, left knee: Secondary | ICD-10-CM

## 2023-04-24 DIAGNOSIS — X58XXXA Exposure to other specified factors, initial encounter: Secondary | ICD-10-CM | POA: Insufficient documentation

## 2023-04-24 DIAGNOSIS — M7041 Prepatellar bursitis, right knee: Secondary | ICD-10-CM | POA: Insufficient documentation

## 2023-04-24 LAB — COMPREHENSIVE METABOLIC PANEL WITH GFR
ALT: 19 U/L (ref 0–44)
AST: 20 U/L (ref 15–41)
Albumin: 3.7 g/dL (ref 3.5–5.0)
Alkaline Phosphatase: 87 U/L (ref 38–126)
Anion gap: 9 (ref 5–15)
BUN: 10 mg/dL (ref 6–20)
CO2: 25 mmol/L (ref 22–32)
Calcium: 9 mg/dL (ref 8.9–10.3)
Chloride: 102 mmol/L (ref 98–111)
Creatinine, Ser: 0.76 mg/dL (ref 0.61–1.24)
GFR, Estimated: >60 mL/min (ref >60)
Glucose, Bld: 104 mg/dL — ABNORMAL HIGH (ref 70–99)
Potassium: 3.7 mmol/L (ref 3.5–5.1)
Sodium: 136 mmol/L (ref 135–145)
Total Bilirubin: 0.9 mg/dL (ref 0.0–1.2)
Total Protein: 7.2 g/dL (ref 6.5–8.1)

## 2023-04-24 LAB — CBC WITH DIFFERENTIAL/PLATELET
Abs Immature Granulocytes: 0.03 10*3/uL (ref 0.00–0.07)
Basophils Absolute: 0.1 10*3/uL (ref 0.0–0.1)
Basophils Relative: 1 %
Eosinophils Absolute: 0.3 10*3/uL (ref 0.0–0.5)
Eosinophils Relative: 3 %
HCT: 41.6 % (ref 39.0–52.0)
Hemoglobin: 14.8 g/dL (ref 13.0–17.0)
Immature Granulocytes: 0 %
Lymphocytes Relative: 26 %
Lymphs Abs: 2.4 10*3/uL (ref 0.7–4.0)
MCH: 33 pg (ref 26.0–34.0)
MCHC: 35.6 g/dL (ref 30.0–36.0)
MCV: 92.7 fL (ref 80.0–100.0)
Monocytes Absolute: 1 10*3/uL (ref 0.1–1.0)
Monocytes Relative: 11 %
Neutro Abs: 5.5 10*3/uL (ref 1.7–7.7)
Neutrophils Relative %: 59 %
Platelets: 308 10*3/uL (ref 150–400)
RBC: 4.49 MIL/uL (ref 4.22–5.81)
RDW: 12.7 % (ref 11.5–15.5)
WBC: 9.2 10*3/uL (ref 4.0–10.5)
nRBC: 0 % (ref 0.0–0.2)

## 2023-04-24 LAB — I-STAT CG4 LACTIC ACID, ED: Lactic Acid, Venous: 1.1 mmol/L (ref 0.5–1.9)

## 2023-04-24 MED ORDER — SULFAMETHOXAZOLE-TRIMETHOPRIM 800-160 MG PO TABS
1.0000 | ORAL_TABLET | Freq: Once | ORAL | Status: AC
  Administered 2023-04-24: 1 via ORAL
  Filled 2023-04-24: qty 1

## 2023-04-24 NOTE — ED Provider Notes (Addendum)
 [place] EMERGENCY DEPARTMENT AT [facility] Provider Note   CSN: [id number] Arrival date & time: 04/24/23  1644     History  Chief Complaint  Patient presents with   Knee Pain    Alex Gordon is a 48 y.o. male.  48 yo M with a chief complaints of left knee pain.  This been ongoing for about a week or so.  He had seen the orthopedist for this and was started on antibiotics.  Looks like he maybe had seen them again today and was told that he had come to the ED for evaluation.  He feels like the redness is spread a bit.  Denies any difficulty taking the antibiotics.  No fevers or chills.  Denies recurrent trauma.   Knee Pain      Home Medications Prior to Admission medications   Medication Sig Start Date End Date Taking? Authorizing Provider  celecoxib (CELEBREX) 200 MG capsule Take 1 capsule (200 mg total) by mouth 2 (two) times daily. 11/18/17   Park Liter, DPM  indomethacin (INDOCIN) 50 MG capsule Take 50 mg by mouth 2 (two) times daily with a meal.    [provider]  meloxicam (MOBIC) 15 MG tablet Take 1 tablet (15 mg total) by mouth daily. 11/18/17   Park Liter, DPM  traMADol (ULTRAM) 50 MG tablet Take 1 tablet (50 mg total) by mouth every 6 (six) hours as needed. 10/28/17   Georgina Quint, MD      Allergies    Patient has no known allergies.    Review of Systems   Review of Systems  Physical Exam Updated Vital Signs BP (!) 151/88   Pulse 84   Temp 98.2 F (36.8 C)   Resp 19   SpO2 98%  Physical Exam Vitals and nursing note reviewed.  Constitutional:      Appearance: He is well-developed.  HENT:     Head: Normocephalic and atraumatic.  Eyes:     Pupils: Pupils are equal, round, and reactive to light.  Neck:     Vascular: No JVD.  Cardiovascular:     Rate and Rhythm: Normal rate and regular rhythm.     Heart sounds: No murmur heard.    No friction rub. No gallop.  Pulmonary:     Effort: No respiratory distress.      Breath sounds: No wheezing.  Abdominal:     General: There is no distension.     Tenderness: There is no abdominal tenderness. There is no guarding or rebound.  Musculoskeletal:        General: Normal range of motion.     Cervical back: Normal range of motion and neck supple.     Comments: Erythema and tenderness about the left prepatellar space.  Some mild fluctuance.  Some very mild surrounding erythema.  Pulse motor and sensation are intact distally.  Able to range the knee without significant discomfort.  Skin:    Coloration: Skin is not pale.     Findings: No rash.  Neurological:     Mental Status: He is alert and oriented to person, place, and time.  Psychiatric:        Behavior: Behavior normal.     ED Results / Procedures / Treatments   Labs (all labs ordered are listed, but only abnormal results are displayed) Labs Reviewed  COMPREHENSIVE METABOLIC PANEL WITH GFR - Abnormal; Notable for the following components:      Result Value   Glucose,  Bld 104 (*)    All other components within normal limits  CBC WITH DIFFERENTIAL/PLATELET  I-STAT CG4 LACTIC ACID, ED    EKG None  Radiology No results found.  Procedures Procedures    Medications Ordered in ED Medications  sulfamethoxazole-trimethoprim (BACTRIM DS) 800-160 MG per tablet 1 tablet (has no administration in time range)    ED Course/ Medical Decision Making/ A&P                                 Medical Decision Making Amount and/or Complexity of Data Reviewed Labs: ordered.  Risk Prescription drug management.   48 yo M with a chief complaints of right knee pain and swelling.  Clinically the patient has prepatellar bursitis.  Looks like he was seen at the [facility] clinic earlier today and was sent here.  I discussed the case with orthopedics on-call.  Dr. Odis Hollingshead.  Based on my exam and history he thought reasonable to broaden antibiotics and have him seen back in the clinic.  Patient had  blood work done here lactate was normal no leukocytosis no electrolyte abnormalities.  Renal function normal.  6:05 PM:  I have discussed the diagnosis/risks/treatment options with the patient and family.  Evaluation and diagnostic testing in the emergency department does not suggest an emergent condition requiring admission or immediate intervention beyond what has been performed at this time.  They will follow up with Ortho. We also discussed returning to the ED immediately if new or worsening sx occur. We discussed the sx which are most concerning (e.g., sudden worsening pain, fever, inability to tolerate by mouth) that necessitate immediate return. Medications administered to the patient during their visit and any new prescriptions provided to the patient are listed below.  Medications given during this visit Medications  sulfamethoxazole-trimethoprim (BACTRIM DS) 800-160 MG per tablet 1 tablet (has no administration in time range)     The patient appears reasonably screen and/or stabilized for discharge and I doubt any other medical condition or other [facility] requiring further screening, evaluation, or treatment in the ED at this time prior to discharge.          Final Clinical Impression(s) / ED Diagnoses Final diagnoses:  Prepatellar bursitis of right knee    Rx / DC Orders ED Discharge Orders     None           Melene Plan, DO 04/24/23 1806

## 2023-04-24 NOTE — ED Triage Notes (Signed)
 Pt c.o left knee pain since Tuesday, [facility], had an xray done and was told to come here due to possible infection in his bone. Redness and swelling noted to knee. 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

## 2023-04-24 NOTE — Discharge Instructions (Addendum)
 Continue your Keflex.  I have also started you on Bactrim.  Please follow-up with the orthopedic clinic in the office.

## 2023-06-14 ENCOUNTER — Ambulatory Visit: Payer: Self-pay | Admitting: Neurology

## 2023-10-25 ENCOUNTER — Ambulatory Visit: Payer: Self-pay | Admitting: Neurology

## 2024-02-01 ENCOUNTER — Ambulatory Visit: Payer: Self-pay | Admitting: Neurology
